# Patient Record
Sex: Female | Born: 1960 | Race: Black or African American | Hispanic: No | Marital: Single | Smoking: Never smoker
Health system: Southern US, Community
[De-identification: ages and names within clinical notes are randomized; demographics above are authoritative.]

## PROBLEM LIST (undated history)

## (undated) DIAGNOSIS — D649 Anemia, unspecified: Principal | ICD-10-CM

## (undated) HISTORY — DX: Anemia, unspecified: D64.9

---

## 1995-11-29 HISTORY — PX: ABDOMINAL SURGERY: SHX537

## 1999-03-08 ENCOUNTER — Other Ambulatory Visit: Admission: RE | Admit: 1999-03-08 | Discharge: 1999-03-08 | Payer: Self-pay | Admitting: Obstetrics and Gynecology

## 2000-03-22 ENCOUNTER — Other Ambulatory Visit: Admission: RE | Admit: 2000-03-22 | Discharge: 2000-03-22 | Payer: Self-pay | Admitting: Obstetrics and Gynecology

## 2001-08-07 ENCOUNTER — Other Ambulatory Visit: Admission: RE | Admit: 2001-08-07 | Discharge: 2001-08-07 | Payer: Self-pay | Admitting: Obstetrics and Gynecology

## 2001-08-21 ENCOUNTER — Encounter: Admission: RE | Admit: 2001-08-21 | Discharge: 2001-08-21 | Payer: Self-pay | Admitting: General Surgery

## 2001-08-21 ENCOUNTER — Encounter: Payer: Self-pay | Admitting: General Surgery

## 2002-12-26 ENCOUNTER — Other Ambulatory Visit: Admission: RE | Admit: 2002-12-26 | Discharge: 2002-12-26 | Payer: Self-pay | Admitting: Obstetrics and Gynecology

## 2002-12-31 ENCOUNTER — Encounter: Payer: Self-pay | Admitting: Obstetrics and Gynecology

## 2002-12-31 ENCOUNTER — Ambulatory Visit (HOSPITAL_COMMUNITY): Admission: RE | Admit: 2002-12-31 | Discharge: 2002-12-31 | Payer: Self-pay | Admitting: Obstetrics and Gynecology

## 2003-08-12 ENCOUNTER — Emergency Department (HOSPITAL_COMMUNITY): Admission: EM | Admit: 2003-08-12 | Discharge: 2003-08-12 | Payer: Self-pay | Admitting: Emergency Medicine

## 2003-11-03 ENCOUNTER — Other Ambulatory Visit: Admission: RE | Admit: 2003-11-03 | Discharge: 2003-11-03 | Payer: Self-pay | Admitting: Obstetrics and Gynecology

## 2004-12-29 ENCOUNTER — Other Ambulatory Visit: Admission: RE | Admit: 2004-12-29 | Discharge: 2004-12-29 | Payer: Self-pay | Admitting: Obstetrics and Gynecology

## 2005-11-30 ENCOUNTER — Encounter: Admission: RE | Admit: 2005-11-30 | Discharge: 2005-11-30 | Payer: Self-pay | Admitting: Obstetrics and Gynecology

## 2007-02-04 IMAGING — US UNKNOWN US STUDY
1 series · 5 of 5 positions shown · non-contrast
Comparison: none

UNILATERAL RIGHT DIAGNOSTIC DIGITAL MAMMOGRAM AND RIGHT BREAST ULTRASOUND:
CLINICAL DATA: Screening call-back for right breast mass. Pathology of this area on previous 
biopsy in 9559 demonstrated changes that were felt to be consistent with fibroadenoma, which was 
concordant with the imaging findings at that time.

[Series 1: unknown us study · 5 of 5 slices shown]
[im 1/5]
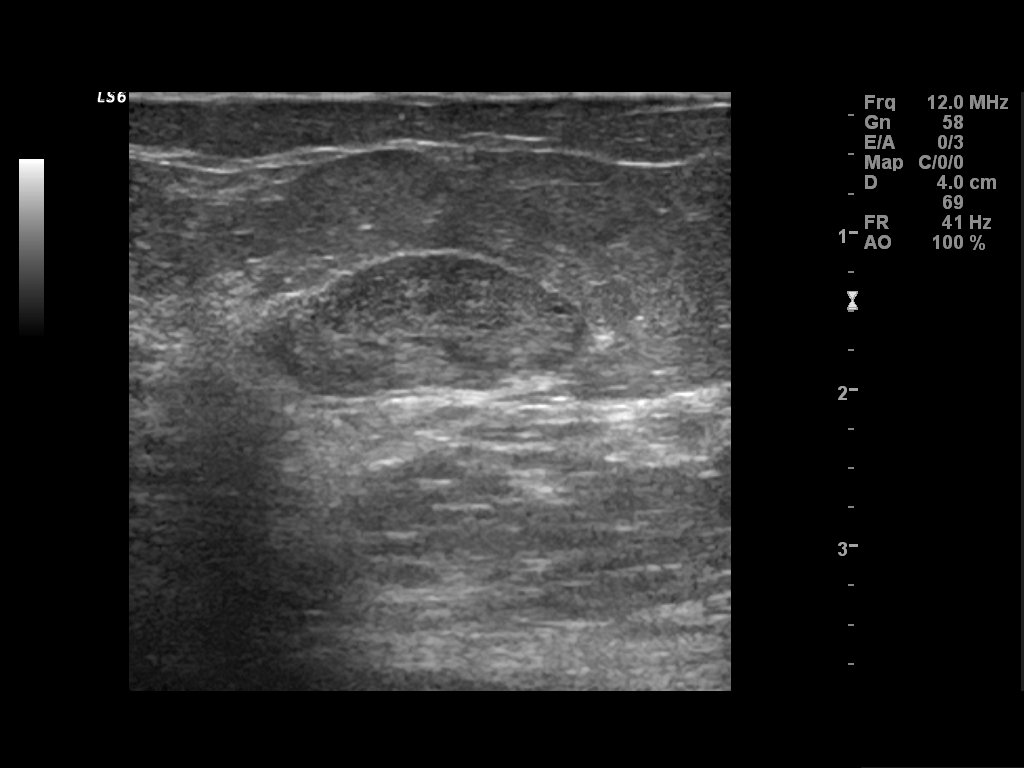
[im 2/5]
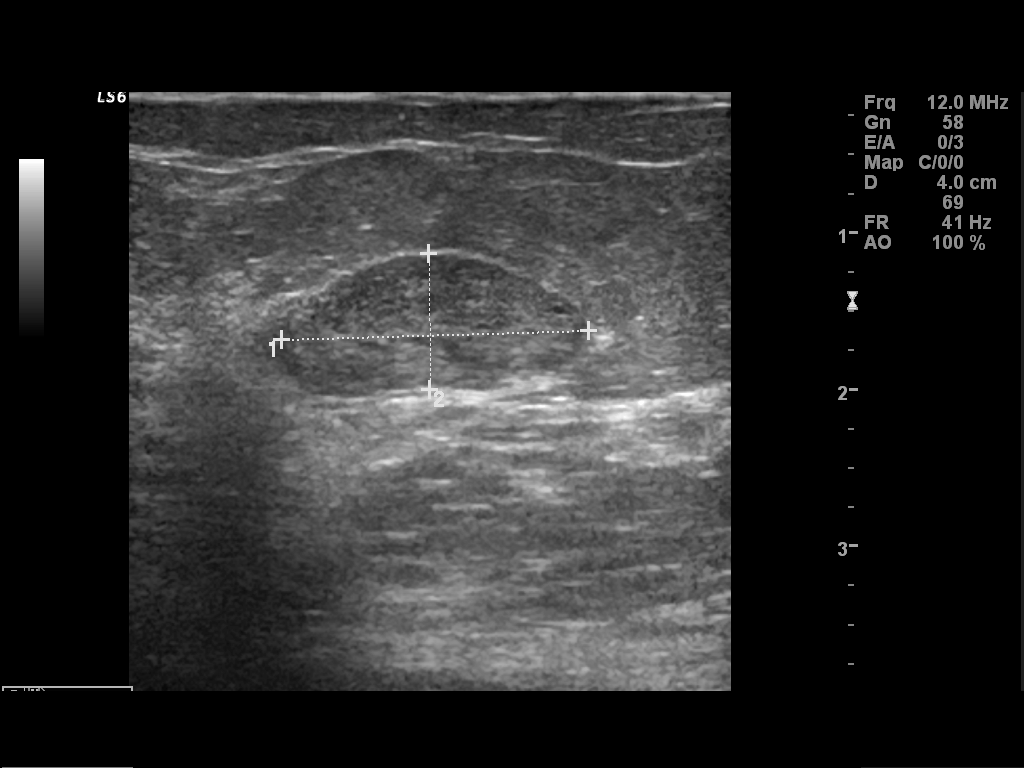
[im 3/5]
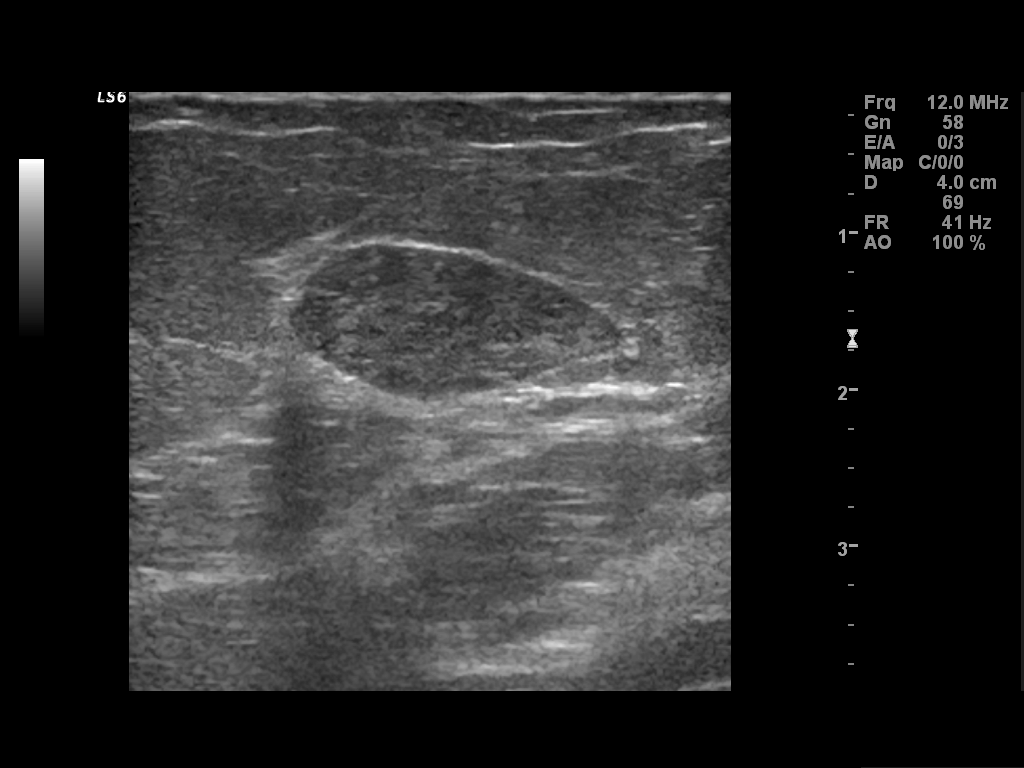
[im 4/5]
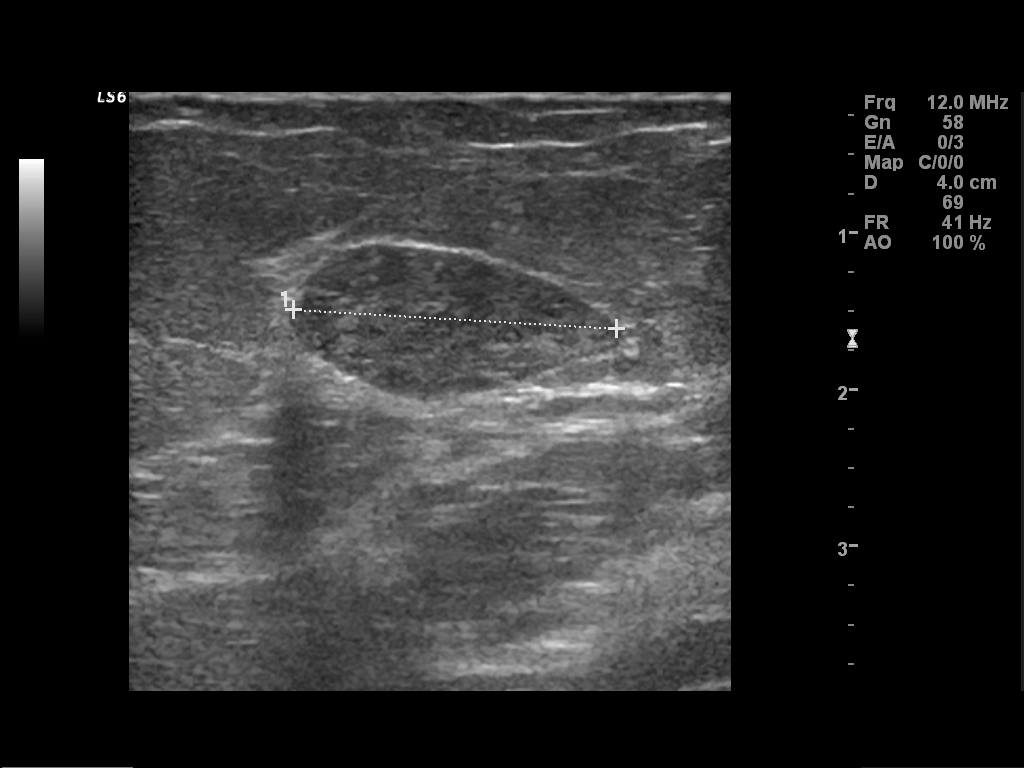
[im 5/5]
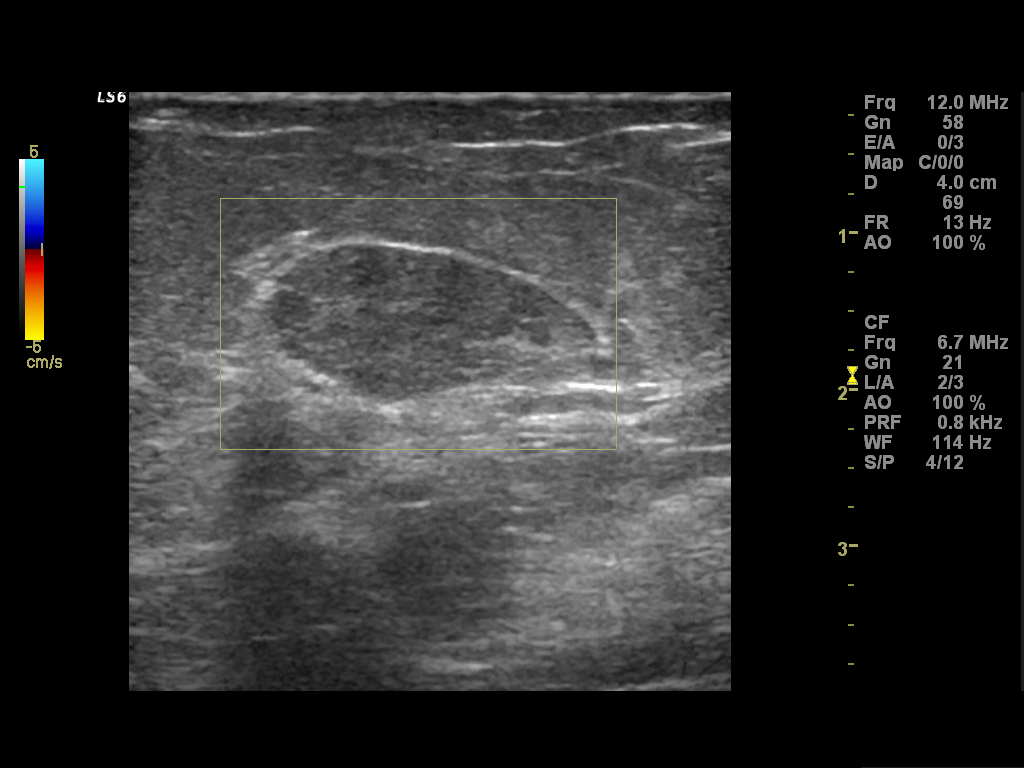

[5 of 5 positions shown; findings below may reference images not displayed]

Comparison with mammograms from [REDACTED] dated 11-03-05, 08-01-03 and ultrasound-guided 
core biopsy 08-21-01.

The previously seen oval circumscribed mass in the right upper outer quadrant measuring 2.5 x
cm is confirmed when compared to the screening study.  Ultrasound is recommended.

Ultrasound of the right breast 10 o'clock location, 7 cm from the nipple, demonstrates a 2.1 x
x 0.9 cm circumscribed oval isoechoic mass with no appreciable through transmission or shadowing.  
This corresponds to the mammographic finding.
IMPRESSION: Interval increase in size since the multiple years prior studies of a right breast mass in the 10 
o'clock location, previously biopsied and pathologically proven to likely be a fibroadenoma. Given 
that the imaging findings are consistent with this diagnosis and no worrisome feature is seen, this
is amenable to six month interval right breast ultrasound follow-up. Fibroadenomata may change 
with size over time, as discussed with the patient.  She is instructed to contact us sooner should 
the area become palpable or if she should change her mind regarding follow-up and opt for either 
biopsy or surgical excision, both of which were discussed with the patient as alternatives to 
follow-up.  Findings and recommendations discussed with the patient and provided in written form at
the time of the exam.

ASSESSMENT: Probably benign - BI-RADS 3

Ultrasound of the right breast in 6 months.

## 2007-02-04 IMAGING — MG MM MAMMO LIMITED*R*
2 series · 2 of 2 positions shown · non-contrast
Comparison: none

UNILATERAL RIGHT DIAGNOSTIC DIGITAL MAMMOGRAM AND RIGHT BREAST ULTRASOUND:
CLINICAL DATA: Screening call-back for right breast mass. Pathology of this area on previous 
biopsy in 9559 demonstrated changes that were felt to be consistent with fibroadenoma, which was 
concordant with the imaging findings at that time.

[R CC]
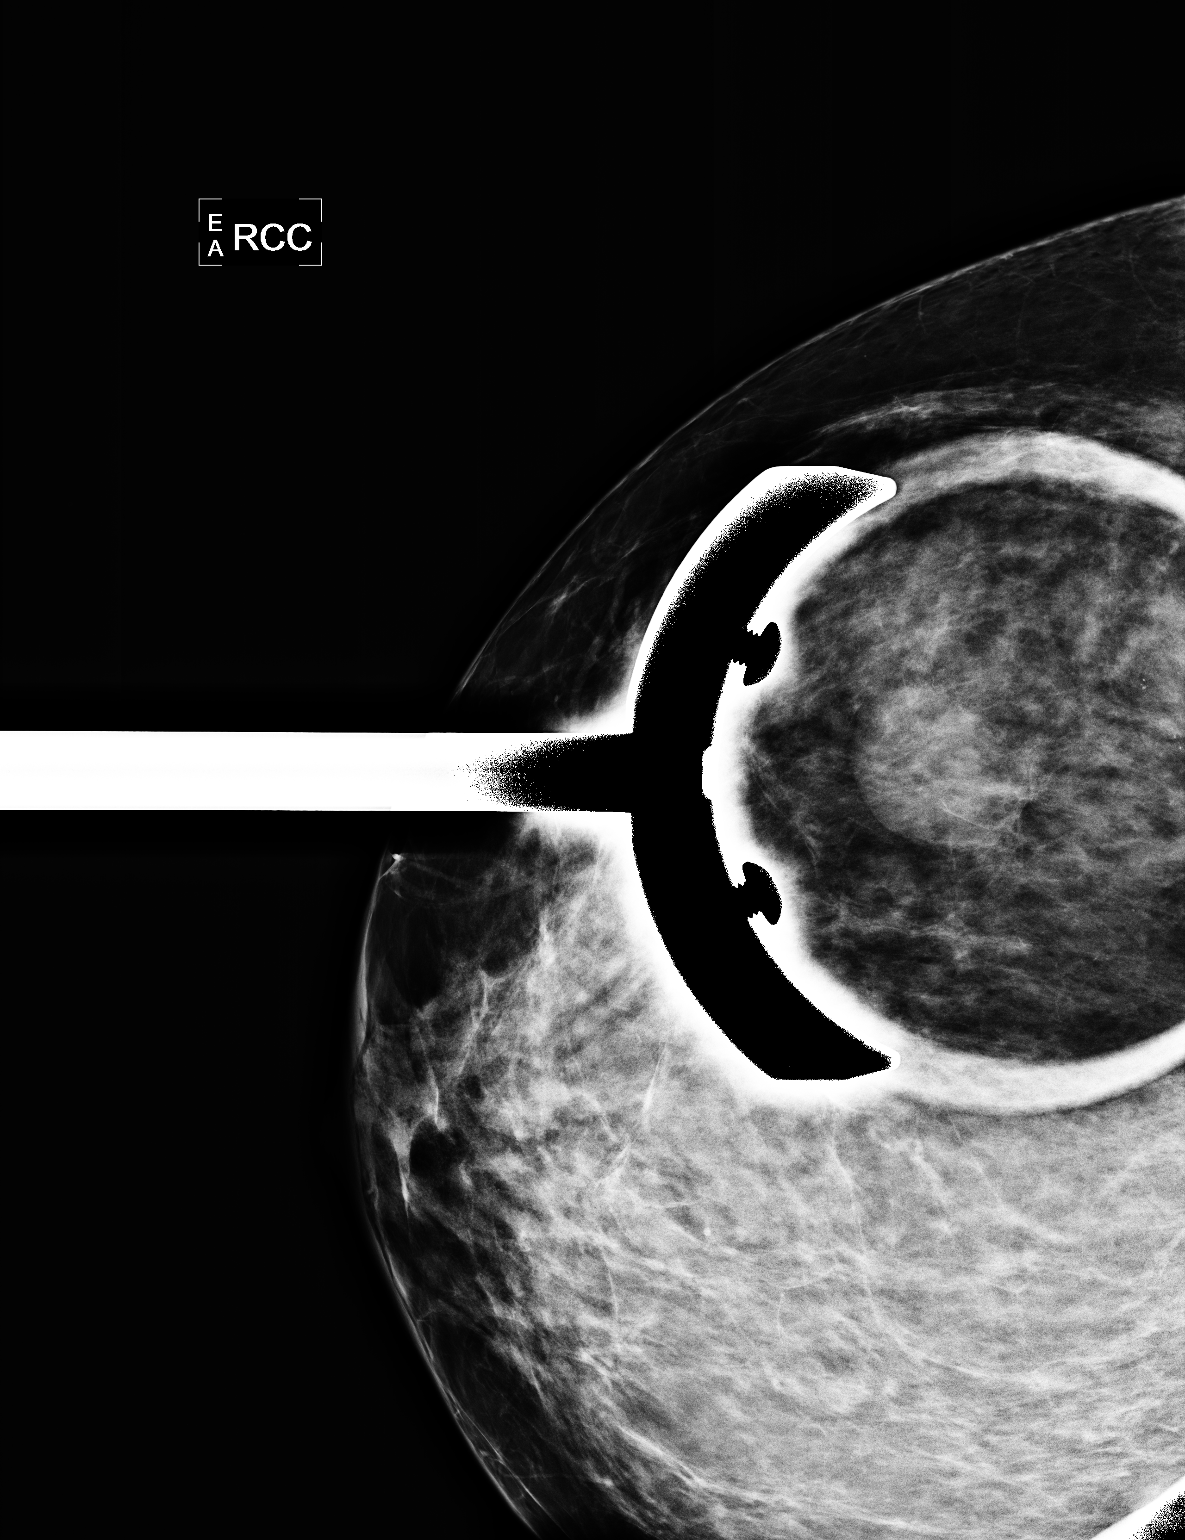

[R MLO]
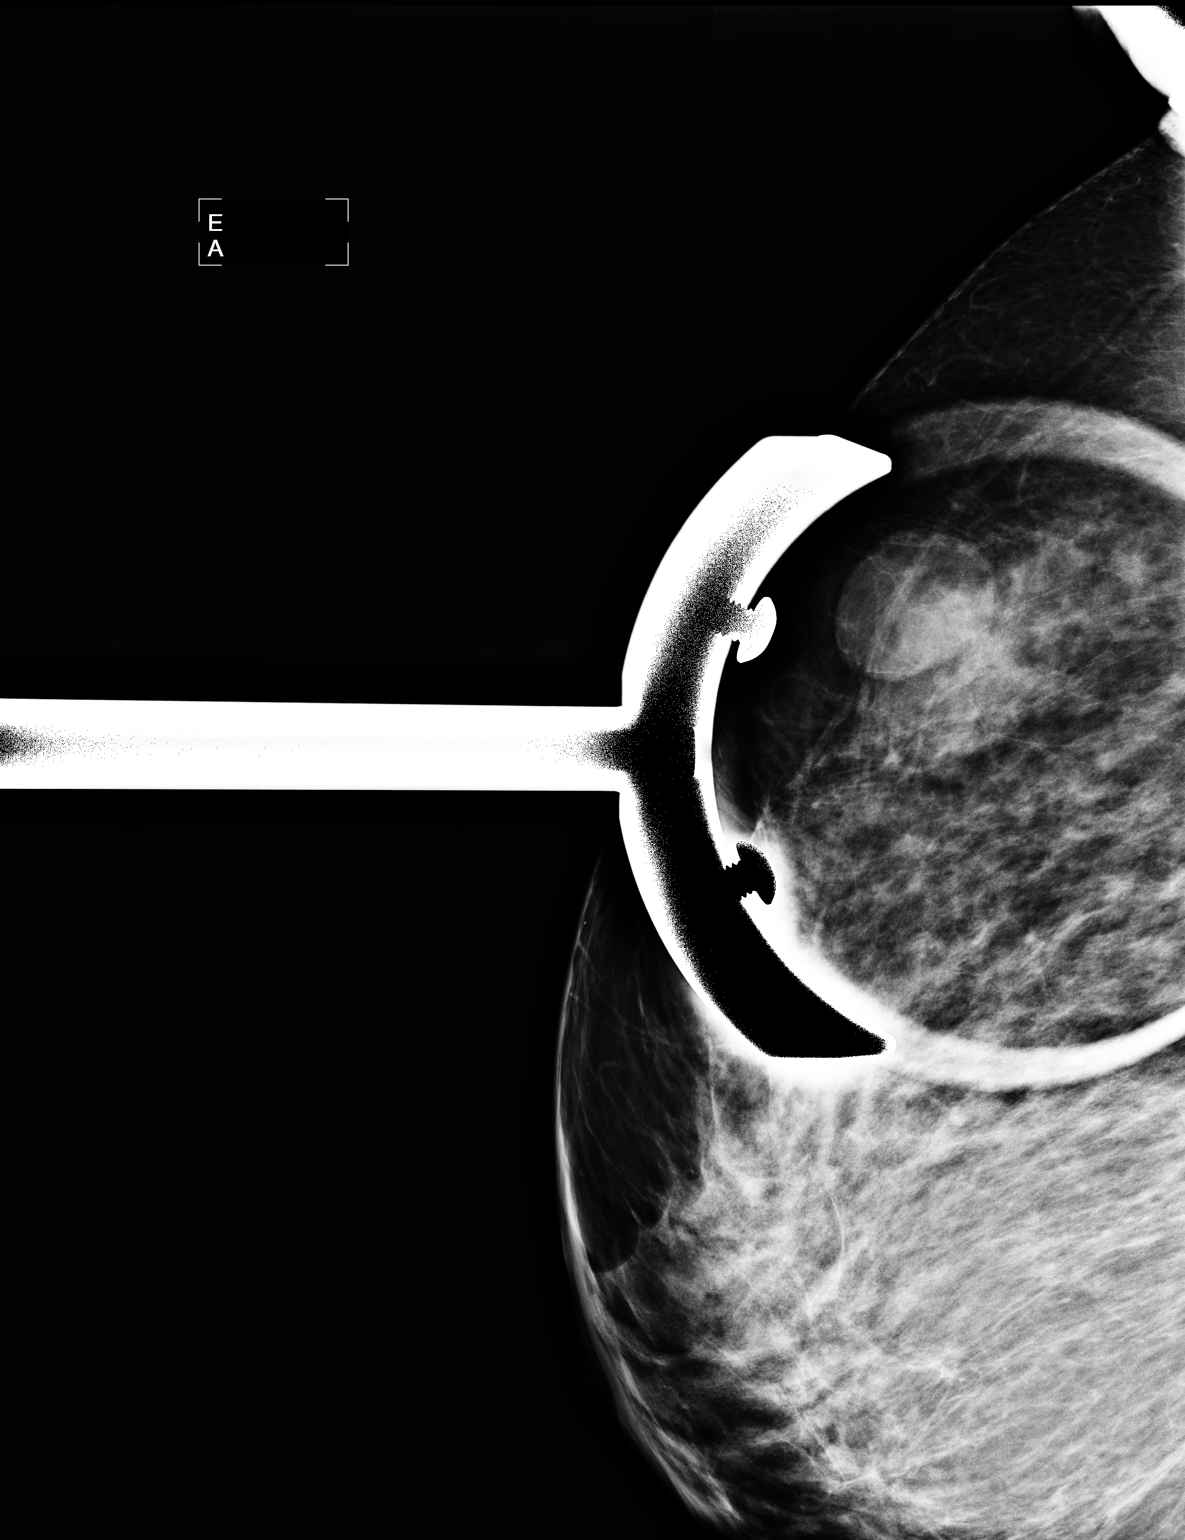

[2 of 2 positions shown; findings below may reference images not displayed]

Comparison with mammograms from [REDACTED] dated 11-03-05, 08-01-03 and ultrasound-guided 
core biopsy 08-21-01.

The previously seen oval circumscribed mass in the right upper outer quadrant measuring 2.5 x
cm is confirmed when compared to the screening study.  Ultrasound is recommended.

Ultrasound of the right breast 10 o'clock location, 7 cm from the nipple, demonstrates a 2.1 x
x 0.9 cm circumscribed oval isoechoic mass with no appreciable through transmission or shadowing.  
This corresponds to the mammographic finding.
IMPRESSION: Interval increase in size since the multiple years prior studies of a right breast mass in the 10 
o'clock location, previously biopsied and pathologically proven to likely be a fibroadenoma. Given 
that the imaging findings are consistent with this diagnosis and no worrisome feature is seen, this
is amenable to six month interval right breast ultrasound follow-up. Fibroadenomata may change 
with size over time, as discussed with the patient.  She is instructed to contact us sooner should 
the area become palpable or if she should change her mind regarding follow-up and opt for either 
biopsy or surgical excision, both of which were discussed with the patient as alternatives to 
follow-up.  Findings and recommendations discussed with the patient and provided in written form at
the time of the exam.

ASSESSMENT: Probably benign - BI-RADS 3

Ultrasound of the right breast in 6 months.

## 2011-10-04 ENCOUNTER — Emergency Department
Admission: EM | Admit: 2011-10-04 | Discharge: 2011-10-04 | Disposition: A | Discharge status: Home / Self Care | Payer: BC Managed Care – PPO | Source: Home / Self Care | Attending: Emergency Medicine | Admitting: Emergency Medicine

## 2011-10-04 ENCOUNTER — Encounter: Payer: Self-pay | Admitting: *Deleted

## 2011-10-04 DIAGNOSIS — G43009 Migraine without aura, not intractable, without status migrainosus: Secondary | ICD-10-CM

## 2011-10-04 MED ORDER — TRAMADOL-ACETAMINOPHEN 37.5-325 MG PO TABS: 2.0000 | ORAL_TABLET | Freq: Four times a day (QID) | ORAL | Status: AC | PRN

## 2011-10-04 MED ORDER — KETOROLAC TROMETHAMINE 60 MG/2ML IM SOLN
60.0000 mg | Freq: Once | INTRAMUSCULAR | Status: AC
  Administered 2011-10-04: 60 mg via INTRAMUSCULAR

## 2011-10-04 MED ORDER — ONDANSETRON 4 MG PO TBDP: 4.0000 mg | ORAL_TABLET | Freq: Three times a day (TID) | ORAL | Status: AC | PRN

## 2011-10-04 NOTE — ED Notes (Signed)
Pt c/o headache x Sunday. She believes that her HA could be related to stress and her menstrual cycle. She has taken Aleve with no relief.

## 2011-10-04 NOTE — ED Provider Notes (Signed)
History     CSN: [id number] Arrival date & time: 10/04/2011  9:26 AM   First MD Initiated Contact with Patient 10/04/11 [phone]      Chief Complaint  Patient presents with  . Headache    (Consider location/radiation/quality/duration/timing/severity/associated sxs/prior treatment) Patient is a 50 y.o. female presenting with headaches. The history is provided by the patient.  Headache The primary symptoms include headaches (Right peri-orbital and right temporal. Current intensity 4/10. Was a 9 out of 10 two days ago.) and nausea. Primary symptoms do not include syncope, loss of consciousness, altered mental status, seizures, dizziness, visual change, paresthesias, focal weakness, loss of sensation, speech change, memory loss, fever or vomiting. The symptoms began 2 days ago. The symptoms are improving. Context: Usually occur monthly around her period, and her LNMP started 3 days ago. Also, she feels stress is a factor, as she works full-time, and pursuing her [employer], and is a parent of a teenager.  The headache is associated with photophobia. The headache is not associated with aura, visual change, neck stiffness, paresthesias, weakness or loss of balance.  Additional symptoms include photophobia. Additional symptoms do not include neck stiffness, weakness, lower back pain, leg pain, loss of balance, aura, hallucinations, taste disturbance, tinnitus or vertigo. Medical issues do not include seizures, cerebral vascular accident, cancer, alcohol use, drug use, diabetes, hypertension or recent surgery.    History reviewed. No pertinent past medical history.  Past Surgical History  Procedure Date  . Abdominal surgery 1997    c-section    Family History  Problem Relation Age of Onset  . Diabetes Father   . Hypertension Father     History  Substance Use Topics  . Smoking status: Never Smoker   . Smokeless tobacco: Not on file  . Alcohol Use: Yes     occasionally 1 drink per mth    OB  History    Grav Para Term Preterm Abortions TAB SAB Ect Mult Living                  Review of Systems  Constitutional: Negative for fever.  HENT: Negative for nosebleeds, congestion, facial swelling, neck pain, neck stiffness and tinnitus.   Eyes: Positive for photophobia. Negative for visual disturbance.  Cardiovascular: Negative for syncope.  Gastrointestinal: Positive for nausea. Negative for vomiting.  Neurological: Positive for headaches (Right peri-orbital and right temporal. Current intensity 4/10. Was a 9 out of 10 two days ago.). Negative for dizziness, vertigo, speech change, focal weakness, seizures, loss of consciousness, weakness, paresthesias and loss of balance.  Psychiatric/Behavioral: Negative.  Negative for hallucinations, memory loss and altered mental status.  All other systems reviewed and are negative.    Allergies  Tetracyclines & related  Home Medications   Current Outpatient Rx  Name Route Sig Dispense Refill  . ONDANSETRON 4 MG PO TBDP Oral Take 1 tablet (4 mg total) by mouth every 8 (eight) hours as needed for nausea (migraine). 12 tablet 0  . TRAMADOL-ACETAMINOPHEN 37.5-325 MG PO TABS Oral Take 2 tablets by mouth every 6 (six) hours as needed (headache). 20 tablet 0    BP 147/96  Pulse 94  Temp(Src) 98.4 F (36.9 C) (Oral)  Resp 18  Ht 5\' 7"  (1.702 m)  Wt 176 lb 8 oz (80.06 kg)  BMI 27.64 kg/m2  SpO2 97%  LMP 10/01/2011  Physical Exam  Nursing note and vitals reviewed. Constitutional: She appears well-developed and well-nourished. She appears distressed (Uncomfortable from headache.).  HENT:  Head: Normocephalic  and atraumatic. Head is without raccoon's eyes, without contusion, without right periorbital erythema and without left periorbital erythema.  Right Ear: External ear and ear canal normal. No drainage. Tympanic membrane is not erythematous.  Left Ear: External ear and ear canal normal. No drainage. Tympanic membrane is not  erythematous.  Nose: Nose normal. Right sinus exhibits no maxillary sinus tenderness. Left sinus exhibits no maxillary sinus tenderness.  Mouth/Throat: Oropharynx is clear and moist and mucous membranes are normal. No oral lesions. No dental abscesses. No oropharyngeal exudate, posterior oropharyngeal edema or posterior oropharyngeal erythema.       + Tenderness to palpation right temporal area.    No cranial bruits.  Normal temporal arteries.  Eyes: Conjunctivae, EOM and lids are normal. Pupils are equal, round, and reactive to light.  Fundoscopic exam:      The right eye shows no exudate, no hemorrhage and no papilledema.       The left eye shows no exudate, no hemorrhage and no papilledema.  Neck: Neck supple. Normal carotid pulses and no JVD present. Carotid bruit is not present. No tracheal deviation present. No thyromegaly present.  Cardiovascular: Regular rhythm and normal heart sounds.   Pulmonary/Chest: Effort normal and breath sounds normal. No respiratory distress. She has no wheezes. She has no rales.  Abdominal: Soft. There is no tenderness.  Musculoskeletal: She exhibits no edema and no tenderness.  Neurological: She is alert. She has normal strength and normal reflexes. No cranial nerve deficit or sensory deficit. Coordination normal.  Reflex Scores:      Tricep reflexes are 2+ on the right side and 2+ on the left side.      Bicep reflexes are 2+ on the right side and 2+ on the left side.      Brachioradialis reflexes are 2+ on the right side and 2+ on the left side.      Patellar reflexes are 2+ on the right side and 2+ on the left side.      Achilles reflexes are 2+ on the right side and 2+ on the left side. Skin: Skin is warm and dry. No rash noted.  Psychiatric: She has a normal mood and affect.    ED Course  Procedures:  Toradol 6 mg IM. Good response. Headache improved.   Dx: 1. Migraine without aura    Plans: See instruction sheet. Given to patient.  Questions invited and answered. Rx: ondansetron (ZOFRAN ODT) 4 MG disintegrating tablet Take 1 tablet (4 mg total) by mouth every 8 (eight) hours as needed for nausea (migraine).traMADol-acetaminophen (ULTRACET) 37.5-325 MG per tablet Take 2 tablets by mouth every 6 (six) hours as needed (headache). Follow-up with a headache specialist in 1 week, sooner prn.           Lonell Face, MD 10/04/11 [phone]

## 2012-07-25 ENCOUNTER — Encounter: Payer: Self-pay | Admitting: *Deleted

## 2012-07-25 ENCOUNTER — Emergency Department
Admission: EM | Admit: 2012-07-25 | Discharge: 2012-07-25 | Disposition: A | Discharge status: Home / Self Care | Payer: BC Managed Care – PPO | Source: Home / Self Care

## 2012-07-25 DIAGNOSIS — Z3202 Encounter for pregnancy test, result negative: Secondary | ICD-10-CM

## 2012-07-25 NOTE — ED Provider Notes (Signed)
Agree with exam, assessment, and plan.   Stephen A Beese, MD 07/25/12 1929 

## 2012-07-25 NOTE — ED Provider Notes (Signed)
History     CSN: [id number]  Arrival date & time 07/25/12  1801     Chief Complaint  Patient presents with  . Possible Pregnancy    HPI Comments: Patient presents today for a urine pregnancy test.  Her LMP was 06/11/2012.  She is concerned that she might be pregnant because she has missed her period by 2 weeks.  She has done 2 pregnancy tests at home which were negative.  She is also concerned whether she is going through menopause.    Denies experiencing hot flashes.  The history is provided by the patient.    History reviewed. No pertinent past medical history.  Past Surgical History  Procedure Date  . Abdominal surgery 1997    c-section    Family History  Problem Relation Age of Onset  . Diabetes Father   . Hypertension Father     History  Substance Use Topics  . Smoking status: Never Smoker   . Smokeless tobacco: Not on file  . Alcohol Use: Yes     occasionally 1 drink per mth    OB History    Grav Para Term Preterm Abortions TAB SAB Ect Mult Living                  Review of Systems  Constitutional: Negative.   HENT: Negative.   Eyes: Negative.   Respiratory: Negative.   Cardiovascular: Negative.   Gastrointestinal: Negative.   Genitourinary: Negative.   Musculoskeletal: Negative.   Neurological: Negative.   All other systems reviewed and are negative.    Allergies  Tetracyclines & related  Home Medications  No current outpatient prescriptions on file.  BP 133/84  Pulse 93  Temp 98.2 F (36.8 C) (Oral)  Resp 16  Ht 5\' 7"  (1.702 m)  Wt 178 lb (80.74 kg)  BMI 27.88 kg/m2  LMP 06/11/2012  Physical Exam  Nursing note and vitals reviewed.   ED Course  Procedures    Labs Reviewed  POCT URINE PREGNANCY: Negative     1. Encounter for pregnancy test with result negative       MDM  Urine Pregnancy test negative. She is concerned whether she starting menopause since she has missed her period by 2 weeks.  Referred her to her  gynecologist for further testing.        Junius Roads, NP 07/25/12 1848

## 2012-07-25 NOTE — ED Notes (Signed)
The pt is here today for a pregnancy test.

## 2013-03-04 ENCOUNTER — Telehealth: Payer: Self-pay | Admitting: Oncology

## 2013-03-04 NOTE — Telephone Encounter (Signed)
S/W PT IN RE TO NP APPT 05/07 @ 1:30 W/DR. SHADAD REFERRING DR. Richarda Overlie DX-AMEMIA WELCOME PACKET MAILED.

## 2013-03-04 NOTE — Telephone Encounter (Signed)
LVOM FOR PT TO RETURN CALL.  °

## 2013-03-06 ENCOUNTER — Telehealth: Payer: Self-pay | Admitting: Oncology

## 2013-03-06 NOTE — Telephone Encounter (Signed)
C/D 03/06/13 for appt. 04/03/13

## 2013-04-03 ENCOUNTER — Other Ambulatory Visit: Payer: BC Managed Care – PPO | Admitting: Lab

## 2013-04-03 ENCOUNTER — Ambulatory Visit: Payer: BC Managed Care – PPO | Admitting: Oncology

## 2013-04-03 ENCOUNTER — Ambulatory Visit: Payer: BC Managed Care – PPO

## 2013-04-12 ENCOUNTER — Other Ambulatory Visit: Payer: Self-pay | Admitting: Oncology

## 2013-04-12 DIAGNOSIS — D500 Iron deficiency anemia secondary to blood loss (chronic): Secondary | ICD-10-CM

## 2013-04-17 ENCOUNTER — Other Ambulatory Visit (HOSPITAL_BASED_OUTPATIENT_CLINIC_OR_DEPARTMENT_OTHER): Payer: BC Managed Care – PPO | Admitting: Lab

## 2013-04-17 ENCOUNTER — Telehealth: Payer: Self-pay | Admitting: Oncology

## 2013-04-17 ENCOUNTER — Ambulatory Visit: Payer: BC Managed Care – PPO

## 2013-04-17 ENCOUNTER — Ambulatory Visit (HOSPITAL_BASED_OUTPATIENT_CLINIC_OR_DEPARTMENT_OTHER): Payer: BC Managed Care – PPO | Admitting: Oncology

## 2013-04-17 ENCOUNTER — Encounter: Payer: Self-pay | Admitting: Oncology

## 2013-04-17 ENCOUNTER — Telehealth: Payer: Self-pay | Admitting: *Deleted

## 2013-04-17 VITALS — BP 149/80 | HR 91 | Temp 98.1°F | Resp 18 | Ht 67.0 in | Wt 168.7 lb

## 2013-04-17 DIAGNOSIS — D259 Leiomyoma of uterus, unspecified: Secondary | ICD-10-CM

## 2013-04-17 DIAGNOSIS — D500 Iron deficiency anemia secondary to blood loss (chronic): Secondary | ICD-10-CM

## 2013-04-17 DIAGNOSIS — D473 Essential (hemorrhagic) thrombocythemia: Secondary | ICD-10-CM

## 2013-04-17 DIAGNOSIS — D649 Anemia, unspecified: Secondary | ICD-10-CM

## 2013-04-17 DIAGNOSIS — D509 Iron deficiency anemia, unspecified: Secondary | ICD-10-CM

## 2013-04-17 DIAGNOSIS — N920 Excessive and frequent menstruation with regular cycle: Secondary | ICD-10-CM

## 2013-04-17 HISTORY — DX: Anemia, unspecified: D64.9

## 2013-04-17 LAB — COMPREHENSIVE METABOLIC PANEL (CC13)
CO2: 23 mEq/L (ref 22–29)
Creatinine: 0.7 mg/dL (ref 0.6–1.1)
Glucose: 123 mg/dl — ABNORMAL HIGH (ref 70–99)
Sodium: 139 mEq/L (ref 136–145)
Total Bilirubin: 0.27 mg/dL (ref 0.20–1.20)
Total Protein: 7.8 g/dL (ref 6.4–8.3)

## 2013-04-17 LAB — CBC WITH DIFFERENTIAL/PLATELET
Eosinophils Absolute: 0 10*3/uL (ref 0.0–0.5)
HCT: 27 % — ABNORMAL LOW (ref 34.8–46.6)
LYMPH%: 31.4 % (ref 14.0–49.7)
MONO#: 0.4 10*3/uL (ref 0.1–0.9)
NEUT#: 4.5 10*3/uL (ref 1.5–6.5)
NEUT%: 61.6 % (ref 38.4–76.8)
Platelets: 524 10*3/uL — ABNORMAL HIGH (ref 145–400)
WBC: 7.3 10*3/uL (ref 3.9–10.3)

## 2013-04-17 LAB — CHCC SMEAR

## 2013-04-17 LAB — FERRITIN: Ferritin: <1 ng/mL — ABNORMAL LOW (ref 10–291)

## 2013-04-17 LAB — IRON AND TIBC: TIBC: 532 ug/dL — ABNORMAL HIGH (ref 250–470)

## 2013-04-17 NOTE — Progress Notes (Signed)
Checked in new pt with no financial concerns. °

## 2013-04-17 NOTE — Telephone Encounter (Signed)
Per staff message and POF I have scheduled appts.  JMW  

## 2013-04-17 NOTE — Telephone Encounter (Signed)
gv and printed appt sched emaield MW to add tx...will call pt with d.t for iron

## 2013-04-17 NOTE — Telephone Encounter (Signed)
s.w. pt and advised on 5.23.14 appt...pt ok and aware

## 2013-04-17 NOTE — Progress Notes (Signed)
Reason for Referral: Anemia.   HPI: Lindsey Barker is a 52 year old woman currently of [place] and have being living there for a while. She is a rather healthy woman with past medical history significant for uterine fibroids and menorrhagia. She have been following up with Dr. Marcelle Barker from OB/GYN regarding this issue. She had been recently started on hormone therapy which have decreased her menstrual bleeding. However over the last few months she have been developing more and more fatigued and more and more tiredness. She had a CBC done on January of 2014 and showed a hemoglobin of 7.9 white cell count of 7.3 and platelet count of 552. Her MCV was low at 64.5 an elevated RDW of 19.1. She has been erratically on iron and have been tolerating it poorly. She have reported problems with constipation and has not been taking it regularly.  She has not reported any bleeding anywhere else. She has not reported any problems with hematochezia, melena, hemoptysis, hematemesis or any epistaxis.  Despite all of these symptoms she still able to function very well and it is to most activities of daily living. She remains very active is a single mother and a full-time job at a [employer].  She denied any chest pain or shortness of breath or any dyspnea on exertion.   Past Medical History  Diagnosis Date  . Anemia, unspecified 04/17/2013  :  Past Surgical History  Procedure Laterality Date  . Abdominal surgery  1997    c-section  :  Current outpatient prescriptions: Current Outpatient Prescriptions  Medication Sig Dispense Refill  . Norethin Ace-Eth Estrad-FE (JUNEL FE 1.5/30 PO) Take 1 tablet by mouth daily.       No current facility-administered medications for this visit.     Allergies  Allergen Reactions  . Tetracyclines & Related   :  Family History  Problem Relation Age of Onset  . Diabetes Father   . Hypertension Father   :  History   Social History  . Marital  Status: Married    Spouse Name: N/A    Number of Children: N/A  . Years of Education: N/A   Occupational History  . Not on file.   Social History Main Topics  . Smoking status: Never Smoker   . Smokeless tobacco: Not on file  . Alcohol Use: Yes     Comment: occasionally 1 drink per mth  . Drug Use: No  . Sexually Active:    Other Topics Concern  . Not on file   Social History Narrative  . No narrative on file  :  A comprehensive review of systems was negative.  Exam: Blood pressure 149/80, pulse 91, temperature 98.1 F (36.7 C), temperature source Oral, resp. rate 18, height 5\' 7"  (1.702 m), weight 168 lb 11.2 oz (76.522 kg). General appearance: alert, cooperative and appears stated age Head: Normocephalic, without obvious abnormality, atraumatic Nose: Nares normal. Septum midline. Mucosa normal. No drainage or sinus tenderness. Throat: lips, mucosa, and tongue normal; teeth and gums normal Neck: no adenopathy, no carotid bruit, no JVD, supple, symmetrical, trachea midline and thyroid not enlarged, symmetric, no tenderness/mass/nodules Resp: clear to auscultation bilaterally Chest wall: no tenderness Cardio: regular rate and rhythm, S1, S2 normal, no murmur, click, rub or gallop GI: soft, non-tender; bowel sounds normal; no masses,  no organomegaly Extremities: extremities normal, atraumatic, no cyanosis or edema Lymph nodes: Cervical, supraclavicular, and axillary nodes normal.   Recent Labs  04/17/13 1044  WBC 7.3  HGB 8.3*  HCT 27.0*  PLT 524*      Blood smear review: Microcytosis and hypochromia.     No results found.  Assessment and Plan:  52 year old a woman with the following issues:  1. Microcytic, hypochromic anemia: The differential diagnosis discussed with [name] Undoubtedly she has iron deficiency anemia likely due to menorrhagia from uterine fibroids. A likely she has a hemoglobinopathy such as sickle cell or thalassemia. To confirm that,  I will repeat her iron studies today including ferritin and total iron. She had been on iron replacement orally but could not tolerate it very well and consistently. She has reported problems with constipation among other GI distress issues. I discussed the alternatives to oral iron replacement in the form of IV iron. There are a number of different formulations that have been used in the past including iron dextran versus Feraheme. The risks and benefits of both approaches were discussed and she is in favor of using Feraheme. Complications from this particular drug was discussed today including infusion-related toxicities such as arthralgias myalgias and rare cases of anaphylaxis. I also discussed with her the possibility that she might need repeat infusions in the future. She is agreeable to proceed and we'll set that up for her in the near future.  2. Thrombocytosis: This is most likely reactive in nature due to her iron deficiency anemia and anticipate that'll correct once her iron deficiency have been corrected.  All her questions are answered today she will followup in 3 months for repeat iron studies.

## 2013-04-19 ENCOUNTER — Ambulatory Visit (HOSPITAL_BASED_OUTPATIENT_CLINIC_OR_DEPARTMENT_OTHER): Payer: BC Managed Care – PPO

## 2013-04-19 VITALS — BP 114/67 | HR 87 | Temp 98.7°F

## 2013-04-19 DIAGNOSIS — D509 Iron deficiency anemia, unspecified: Secondary | ICD-10-CM

## 2013-04-19 DIAGNOSIS — D649 Anemia, unspecified: Secondary | ICD-10-CM

## 2013-04-19 MED ORDER — SODIUM CHLORIDE 0.9 % IV SOLN
1020.0000 mg | Freq: Once | INTRAVENOUS | Status: AC
  Administered 2013-04-19: 1020 mg via INTRAVENOUS
  Filled 2013-04-19: qty 34

## 2013-04-19 MED ORDER — SODIUM CHLORIDE 0.9 % IV SOLN
Freq: Once | INTRAVENOUS | Status: AC
  Administered 2013-04-19: 15:00:00 via INTRAVENOUS

## 2013-04-19 NOTE — Patient Instructions (Addendum)
Ferumoxytol injection What is this medicine? FERUMOXYTOL is an iron complex. Iron is used to make healthy red blood cells, which carry oxygen and nutrients throughout the body. This medicine is used to treat iron deficiency anemia in people with chronic kidney disease. This medicine may be used for other purposes; ask your health care provider or pharmacist if you have questions. What should I tell my health care provider before I take this medicine? They need to know if you have any of these conditions: -anemia not caused by low iron levels -high levels of iron in the blood -magnetic resonance imaging (MRI) test scheduled -an unusual or allergic reaction to iron, other medicines, foods, dyes, or preservatives -pregnant or trying to get pregnant -breast-feeding How should I use this medicine? This medicine is for infusion into a vein. It is given by a health care professional in a hospital or clinic setting. Talk to your pediatrician regarding the use of this medicine in children. Special care may be needed. Overdosage: If you think you've taken too much of this medicine contact a poison control center or emergency room at once. Overdosage: If you think you have taken too much of this medicine contact a poison control center or emergency room at once. NOTE: This medicine is only for you. Do not share this medicine with others. What if I miss a dose? It is important not to miss your dose. Call your doctor or health care professional if you are unable to keep an appointment. What may interact with this medicine? This medicine may interact with the following medications: -other iron products This list may not describe all possible interactions. Give your health care provider a list of all the medicines, herbs, non-prescription drugs, or dietary supplements you use. Also tell them if you smoke, drink alcohol, or use illegal drugs. Some items may interact with your medicine. What should I watch  for while using this medicine? Visit your doctor or healthcare professional regularly. Tell your doctor or healthcare professional if your symptoms do not start to get better or if they get worse. You may need blood work done while you are taking this medicine. You may need to follow a special diet. Talk to your doctor. Foods that contain iron include: whole grains/cereals, dried fruits, beans, or peas, leafy green vegetables, and organ meats (liver, kidney). What side effects may I notice from receiving this medicine? Side effects that you should report to your doctor or health care professional as soon as possible: -allergic reactions like skin rash, itching or hives, swelling of the face, lips, or tongue -breathing problems -changes in blood pressure -feeling faint or lightheaded, falls -fever or chills -flushing, sweating, or hot feelings -swelling of the ankles or feet Side effects that usually do not require medical attention (Report these to your doctor or health care professional if they continue or are bothersome.): -diarrhea -headache -nausea, vomiting -stomach pain This list may not describe all possible side effects. Call your doctor for medical advice about side effects. You may report side effects to FDA at 1-800-FDA-1088. Where should I keep my medicine? This drug is given in a hospital or clinic and will not be stored at home. NOTE: This sheet is a summary. It may not cover all possible information. If you have questions about this medicine, talk to your doctor, pharmacist, or health care provider.  2012, [employer]vier/Gold Standard. (08/06/2008 9:48:25 PM) 

## 2013-07-18 ENCOUNTER — Telehealth: Payer: Self-pay | Admitting: Oncology

## 2013-07-18 ENCOUNTER — Encounter: Payer: Self-pay | Admitting: Oncology

## 2013-07-18 ENCOUNTER — Ambulatory Visit (HOSPITAL_BASED_OUTPATIENT_CLINIC_OR_DEPARTMENT_OTHER): Payer: BC Managed Care – PPO | Admitting: Oncology

## 2013-07-18 ENCOUNTER — Other Ambulatory Visit (HOSPITAL_BASED_OUTPATIENT_CLINIC_OR_DEPARTMENT_OTHER): Payer: BC Managed Care – PPO | Admitting: Lab

## 2013-07-18 VITALS — BP 132/92 | HR 74 | Temp 98.1°F | Resp 18 | Ht 67.0 in | Wt 167.5 lb

## 2013-07-18 DIAGNOSIS — D649 Anemia, unspecified: Secondary | ICD-10-CM

## 2013-07-18 LAB — COMPREHENSIVE METABOLIC PANEL (CC13)
Alkaline Phosphatase: 63 U/L (ref 40–150)
BUN: 11.2 mg/dL (ref 7.0–26.0)
Creatinine: 0.7 mg/dL (ref 0.6–1.1)
Glucose: 70 mg/dl (ref 70–140)
Sodium: 140 mEq/L (ref 136–145)
Total Bilirubin: <0.2 mg/dL (ref 0.20–1.20)

## 2013-07-18 LAB — IRON AND TIBC CHCC
%SAT: 10 % — ABNORMAL LOW (ref 21–57)
Iron: 44 ug/dL (ref 41–142)
TIBC: 438 ug/dL (ref 236–444)
UIBC: 393 ug/dL — ABNORMAL HIGH (ref 120–384)

## 2013-07-18 LAB — CBC WITH DIFFERENTIAL/PLATELET
Basophils Absolute: 0 10*3/uL (ref 0.0–0.1)
EOS%: 0.6 % (ref 0.0–7.0)
HGB: 12.9 g/dL (ref 11.6–15.9)
LYMPH%: 33.2 % (ref 14.0–49.7)
MCH: 27.8 pg (ref 25.1–34.0)
MCV: 85.3 fL (ref 79.5–101.0)
MONO%: 6.8 % (ref 0.0–14.0)
NEUT%: 59.2 % (ref 38.4–76.8)
Platelets: 292 10*3/uL (ref 145–400)
RDW: 17.8 % — ABNORMAL HIGH (ref 11.2–14.5)

## 2013-07-18 LAB — FERRITIN CHCC: Ferritin: 9 ng/ml (ref 9–269)

## 2013-07-18 NOTE — Progress Notes (Signed)
Hematology and Oncology Follow Up Visit  TAYGEN NEWSOME [id number] Apr 23, 1961 52 y.o. 07/18/2013 4:14 PM Meriel Pica, MDHolland, Duke Salvia, MD   Principle Diagnosis: Iron deficiency anemia  Prior Therapy: Feraheme 1,020 mg IV on 04/19/13  Current therapy: Watchful observation  Interim History:  Ms Febres is seen for routine follow up today. She received IV iron in May 2014. She tolerated the IV iron well without infusion reaction. She still has fatigue. 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 No chest pain, shortness of breath, or dyspnea. She continues to have heavy periods that are irregular lasting for 7-8 days. No other bleeding noted.   Medications: I have reviewed the patient's current medications.  Current Outpatient Prescriptions  Medication Sig Dispense Refill  . Norethin Ace-Eth Estrad-FE (JUNEL FE 1.5/30 PO) Take 1 tablet by mouth daily.       No current facility-administered medications for this visit.     Allergies:  Allergies  Allergen Reactions  . Tetracyclines & Related     Past Medical History, Surgical history, Social history, and Family History were reviewed and updated.  Review of Systems: Constitutional:  Negative for fever, chills, night sweats, anorexia, weight loss, pain. Cardiovascular: no chest pain or dyspnea on exertion Respiratory: no cough, shortness of breath, or wheezing Neurological: no TIA or stroke symptoms Dermatological: negative ENT: negative Skin: Negative. Gastrointestinal: no abdominal pain, change in bowel habits, or black or bloody stools Genito-Urinary: no dysuria, trouble voiding, or hematuria Hematological and Lymphatic: negative Breast: negative for breast lumps Musculoskeletal: negative Remaining ROS negative.  Physical Exam: Blood pressure 132/92, pulse 74, temperature 98.1 F (36.7 C), temperature source Oral, resp. rate 18, height 5\' 7"  (1.702 m), weight 167 lb 8 oz (75.978 kg), SpO2 98.00%. ECOG:  0 General appearance: alert, cooperative and no distress Head: Normocephalic, without obvious abnormality, atraumatic Neck: no adenopathy, no carotid bruit, no JVD, supple, symmetrical, trachea midline and thyroid not enlarged, symmetric, no tenderness/mass/nodules Lymph nodes: Cervical, supraclavicular, and axillary nodes normal. Heart:regular rate and rhythm, S1, S2 normal, no murmur, click, rub or gallop Lung:chest clear, no wheezing, rales, normal symmetric air entry, no tachypnea, retractions or cyanosis Abdomen: soft, non-tender, without masses or organomegaly EXT:no erythema, induration, or nodules   Lab Results: Lab Results  Component Value Date   WBC 6.4 07/18/2013   HGB 12.9 07/18/2013   HCT 39.6 07/18/2013   MCV 85.3 07/18/2013   PLT 292 07/18/2013     Chemistry      Component Value Date/Time   NA 140 07/18/2013 1313   K 3.9 07/18/2013 1313   CL 109* 04/17/2013 1044   CO2 22 07/18/2013 1313   BUN 11.2 07/18/2013 1313   CREATININE 0.7 07/18/2013 1313      Component Value Date/Time   CALCIUM 9.2 07/18/2013 1313   ALKPHOS 63 07/18/2013 1313   AST 13 07/18/2013 1313   ALT 23 07/18/2013 1313   BILITOT <0.20 07/18/2013 1313      Impression and Plan: This is a 52 year old female with the following issues: 1. Iron deficiency anemia. Due to uterine fibroids. She is unable to tolerate oral iron. She received IV iron with normalization of her Hgb. We will continue to monitor her periodically and repeat IV iron as needed. 2. Uterine fibroids. She is followed by GYN for this. 3. Thrombocytosis. Resolved. This was likely reactive to her iron deficiency. 4. Follow-up. In 3 months  Spent more than half the time coordinating care.  [place], [place] 8/21/20144:14 PM

## 2013-07-18 NOTE — Telephone Encounter (Signed)
Gave pt appt for lab and MD on November 2014 °

## 2013-09-25 ENCOUNTER — Emergency Department
Admission: EM | Admit: 2013-09-25 | Discharge: 2013-09-25 | Disposition: A | Discharge status: Home / Self Care | Payer: BC Managed Care – PPO | Source: Home / Self Care | Attending: Emergency Medicine | Admitting: Emergency Medicine

## 2013-09-25 ENCOUNTER — Encounter: Payer: Self-pay | Admitting: Emergency Medicine

## 2013-09-25 DIAGNOSIS — J209 Acute bronchitis, unspecified: Secondary | ICD-10-CM

## 2013-09-25 DIAGNOSIS — J0100 Acute maxillary sinusitis, unspecified: Secondary | ICD-10-CM

## 2013-09-25 MED ORDER — AZITHROMYCIN 250 MG PO TABS: ORAL_TABLET | ORAL | Status: DC

## 2013-09-25 MED ORDER — FLUTICASONE PROPIONATE 50 MCG/ACT NA SUSP: NASAL | Status: DC

## 2013-09-25 MED ORDER — PROMETHAZINE-CODEINE 6.25-10 MG/5ML PO SYRP: ORAL_SOLUTION | ORAL | Status: DC

## 2013-09-25 MED ORDER — PSEUDOEPHEDRINE-GUAIFENESIN ER 120-1200 MG PO TB12: ORAL_TABLET | ORAL | Status: DC

## 2013-09-25 NOTE — ED Provider Notes (Signed)
CSN: [id number]     Arrival date & time 09/25/13  1105 History   First MD Initiated Contact with Patient 09/25/13 1147     Chief Complaint  Patient presents with  . Cough  . Nasal Congestion  . Eye Drainage  . Headache   (Consider location/radiation/quality/duration/timing/severity/associated sxs/prior Treatment) HPI URI HISTORY  Lindsey Barker is a 52 y.o. female who complains of onset of cold symptoms for 3 days.  Have been using over-the-counter treatment, which is not helping.  Pt c/o chest and nasal congestion,  sinus HA, and cough x 3 days.    Mild chills/sweats +  Fever  +  Nasal congestion +  Discolored Post-nasal drainage Positive sinus pain/pressure maxillary headache No sore throat  +  Cough, productive of yellow sputum No wheezing Mild chest congestion No hemoptysis No shortness of breath No pleuritic pain  No itchy/red eyes No earache  No nausea No vomiting No abdominal pain No diarrhea  No skin rashes +  Fatigue No myalgias Mild sinus headache  Past Medical History  Diagnosis Date  . Anemia, unspecified 04/17/2013   Past Surgical History  Procedure Laterality Date  . Abdominal surgery  1997    c-section   Family History  Problem Relation Age of Onset  . Diabetes Father   . Hypertension Father    History  Substance Use Topics  . Smoking status: Never Smoker   . Smokeless tobacco: Not on file  . Alcohol Use: Yes     Comment: occasionally 1 drink per mth   OB History   Grav Para Term Preterm Abortions TAB SAB Ect Mult Living                 Review of Systems  All other systems reviewed and are negative.   she states that she had flu shot 3 weeks ago She denies chance of pregnancy. LMP 09/22/2013  Allergies  Tetracyclines & related  Home Medications   Current Outpatient Rx  Name  Route  Sig  Dispense  Refill  . azithromycin (ZITHROMAX Z-PAK) 250 MG tablet      Take 2 tablets on day one, then 1 tablet daily on days 2 through 5  1 each   0   . fluticasone (FLONASE) 50 MCG/ACT nasal spray      1 or 2 sprays each nostril twice a day   16 g   0   . Norethin Ace-Eth Estrad-FE (JUNEL FE 1.5/30 PO)   Oral   Take 1 tablet by mouth daily.         . promethazine-codeine (PHENERGAN WITH CODEINE) 6.25-10 MG/5ML syrup      Take 1-2 teaspoons every 6 hours as needed for cough. May cause drowsiness.   120 mL   0   . Pseudoephedrine-Guaifenesin [phone] MG TB12      Take 1 in the morning, As needed for congestion   20 each   0    BP 149/91  Pulse 93  Temp(Src) 98.3 F (36.8 C) (Oral)  Resp 18  Wt 167 lb (75.751 kg)  BMI 26.15 kg/m2  SpO2 99%  LMP 09/22/2013 Physical Exam  Nursing note and vitals reviewed. Constitutional: She is oriented to person, place, and time. She appears well-developed and well-nourished. No distress.  HENT:  Head: Normocephalic and atraumatic.  Right Ear: Tympanic membrane, external ear and ear canal normal.  Left Ear: Tympanic membrane, external ear and ear canal normal.  Nose: Mucosal edema and rhinorrhea present. Right sinus exhibits maxillary  sinus tenderness. Left sinus exhibits maxillary sinus tenderness.  Mouth/Throat: Oropharynx is clear and moist. No oral lesions. No oropharyngeal exudate.  Eyes: Right eye exhibits no discharge. Left eye exhibits no discharge. No scleral icterus.  Neck: Neck supple.  Cardiovascular: Normal rate, regular rhythm and normal heart sounds.   Pulmonary/Chest: Effort normal. She has no wheezes. She has no rales.  Mild bilateral anterior rhonchi. Good air excursion. No wheezes or rales  Lymphadenopathy:    She has no cervical adenopathy.  Neurological: She is alert and oriented to person, place, and time.  Skin: Skin is warm and dry.   Hacking cough noted  ED Course  Procedures (including critical care time) Labs Review Labs Reviewed - No data to display Imaging Review No results found.  EKG Interpretation     Ventricular Rate:     PR Interval:    QRS Duration:   QT Interval:    QTC Calculation:   R Axis:     Text Interpretation:              MDM   1. Acute maxillary sinusitis   2. Acute bronchitis    Treatment options discussed. After risks, benefits, alternatives discussed, she agrees with the following plans: Various symptomatic nonpharmacologic treatment discussed.--Rest, push fluids  Z-Pak Flonase Sudafed/guaifenesin every morning for congestion, but avoid taking if BP elevated Phenergan With Codeine, 1 or 2 teaspoons at bedtime when necessary cough Precautions discussed. Red flags discussed. Questions invited and answered. Patient voiced understanding and agreement.    Lajean Manes, MD 09/25/13 1246

## 2013-09-25 NOTE — ED Notes (Signed)
Pt c/o chest and nasal congestion, watery eyes, RT sided HA, and cough x 3 days.

## 2013-10-18 ENCOUNTER — Ambulatory Visit: Payer: BC Managed Care – PPO | Admitting: Oncology

## 2013-10-18 ENCOUNTER — Other Ambulatory Visit: Payer: BC Managed Care – PPO | Admitting: Lab

## 2013-11-04 ENCOUNTER — Other Ambulatory Visit: Payer: Self-pay | Admitting: Obstetrics and Gynecology

## 2013-11-04 NOTE — H&P (Signed)
Lindsey Barker  DICTATION # [id number] CSN# [id number]   Meriel Pica, MD 11/04/2013 1:53 PM

## 2013-11-07 NOTE — H&P (Signed)
Lindsey Barker, PARROW               ACCOUNT NO.:  [id number]  MEDICAL RECORD NO.:  [id number]  LOCATION:                                 FACILITY:  PHYSICIAN:  Duke Salvia. Marcelle Overlie, M.D.DATE OF BIRTH:  1961/10/18  DATE OF ADMISSION:  11/15/2013 DATE OF DISCHARGE:                             HISTORY & PHYSICAL   HISTORY OF CHIEF COMPLAINT:  Menorrhagia with anemia.  HISTORY OF PRESENT ILLNESS:  A 52 year old, G1, P2, currently on Gildess FE 1/20 .  This patient has had significant menorrhagia, to the point that her medical doctor has done an iron infusion and had her hemoglobin up from the 7 range up to 12.5.  Attempts in the office in doing FHT could not be accomplished due to extreme cervical stenosis.  Ultrasound October 16, 2013, demonstrated several small 2-cm intramural fibroids. She presents now for D and C, hysteroscopy.  This procedure including specific risks related to bleeding, infection, other complications such as perforation may require additional surgery discussed with her which she understands and accepts.  She will received Cytotec in the vagina the night before surgery to help with cervical dilatation.  PAST MEDICAL HISTORY:  Allergies none.  CURRENT MEDICATIONS:  Gildess FE 1/20.  PREVIOUS SURGERY:  Cesarean section 1997.  REVIEW OF SYSTEMS:  Significant for headache, otherwise negative.  FAMILY HISTORY:  Significant for diabetes.  SOCIAL HISTORY:  She is divorced.  Denies alcohol, tobacco, or drug use.  PHYSICAL EXAMINATION:  VITAL SIGNS:  Temp 98.2, blood pressure 120/82. HEENT:  Unremarkable. NECK:  Supple without masses. LUNGS:  Clear. CARDIOVASCULAR:  Regular rate and rhythm.  No murmurs, rubs, or gallops. BREASTS:  Without masses. ABDOMEN:  Soft, flat, nontender. PELVIC:  Normal external genitalia.  Vagina and cervix clear.  Uterus was 8-10 week size, irregular.  Adnexa negative. EXTREMITIES:  Unremarkable. NEUROLOGIC:   Unremarkable.  IMPRESSION:  Leiomyoma with menorrhagia, significant anemia, status post iron transfusion.  PLAN:  D and C, hysteroscopy.  Procedure and risks discussed as above.     Ivie Savitt M. Marcelle Overlie, M.D.     [facility]/MEDQ  D:  11/04/2013  T:  11/05/2013  Job:  [id number]

## 2013-11-12 ENCOUNTER — Encounter (HOSPITAL_COMMUNITY): Payer: Self-pay | Admitting: Pharmacist

## 2013-11-15 ENCOUNTER — Encounter (HOSPITAL_COMMUNITY): Payer: Self-pay | Admitting: Anesthesiology

## 2013-11-15 ENCOUNTER — Ambulatory Visit (HOSPITAL_COMMUNITY)
Admission: RE | Admit: 2013-11-15 | Discharge: 2013-11-15 | Disposition: A | Discharge status: Home / Self Care | Payer: BC Managed Care – PPO | Source: Ambulatory Visit | Attending: Obstetrics and Gynecology | Admitting: Obstetrics and Gynecology

## 2013-11-15 ENCOUNTER — Encounter (HOSPITAL_COMMUNITY): Payer: BC Managed Care – PPO | Admitting: Anesthesiology

## 2013-11-15 ENCOUNTER — Encounter (HOSPITAL_COMMUNITY)
Admission: RE | Disposition: A | Discharge status: Home / Self Care | Payer: Self-pay | Source: Ambulatory Visit | Attending: Obstetrics and Gynecology

## 2013-11-15 ENCOUNTER — Ambulatory Visit (HOSPITAL_COMMUNITY): Payer: BC Managed Care – PPO | Admitting: Anesthesiology

## 2013-11-15 DIAGNOSIS — N920 Excessive and frequent menstruation with regular cycle: Secondary | ICD-10-CM | POA: Insufficient documentation

## 2013-11-15 DIAGNOSIS — N882 Stricture and stenosis of cervix uteri: Secondary | ICD-10-CM | POA: Insufficient documentation

## 2013-11-15 DIAGNOSIS — D251 Intramural leiomyoma of uterus: Secondary | ICD-10-CM | POA: Insufficient documentation

## 2013-11-15 DIAGNOSIS — D649 Anemia, unspecified: Secondary | ICD-10-CM | POA: Insufficient documentation

## 2013-11-15 HISTORY — PX: DILATATION & CURETTAGE/HYSTEROSCOPY WITH TRUECLEAR: SHX6353

## 2013-11-15 LAB — CBC
Hemoglobin: 12.4 g/dL (ref 12.0–15.0)
MCH: 27.6 pg (ref 26.0–34.0)
MCHC: 32.8 g/dL (ref 30.0–36.0)
MCV: 84 fL (ref 78.0–100.0)
Platelets: 332 10*3/uL (ref 150–400)
RBC: 4.5 MIL/uL (ref 3.87–5.11)
RDW: 13.8 % (ref 11.5–15.5)

## 2013-11-15 LAB — PREGNANCY, URINE: Preg Test, Ur: NEGATIVE

## 2013-11-15 SURGERY — DILATATION & CURETTAGE/HYSTEROSCOPY WITH TRUCLEAR
Anesthesia: General | Site: Vagina

## 2013-11-15 MED ORDER — ONDANSETRON HCL 4 MG/2ML IJ SOLN
INTRAMUSCULAR | Status: AC
  Filled 2013-11-15: qty 2

## 2013-11-15 MED ORDER — FENTANYL CITRATE 0.05 MG/ML IJ SOLN
INTRAMUSCULAR | Status: DC | PRN
  Administered 2013-11-15 (×2): 50 ug via INTRAVENOUS

## 2013-11-15 MED ORDER — LIDOCAINE HCL 1 % IJ SOLN
INTRAMUSCULAR | Status: AC
Start: 2013-11-15 — End: 2013-11-15
  Filled 2013-11-15: qty 20

## 2013-11-15 MED ORDER — KETOROLAC TROMETHAMINE 30 MG/ML IJ SOLN
INTRAMUSCULAR | Status: AC
  Filled 2013-11-15: qty 1

## 2013-11-15 MED ORDER — HYDROCODONE-IBUPROFEN 7.5-200 MG PO TABS: 1.0000 | ORAL_TABLET | Freq: Three times a day (TID) | ORAL | Status: DC | PRN

## 2013-11-15 MED ORDER — LIDOCAINE HCL (CARDIAC) 20 MG/ML IV SOLN
INTRAVENOUS | Status: DC | PRN
  Administered 2013-11-15: 70 mg via INTRAVENOUS
  Administered 2013-11-15: 30 mg via INTRAVENOUS

## 2013-11-15 MED ORDER — LACTATED RINGERS IV SOLN
INTRAVENOUS | Status: DC
  Administered 2013-11-15: 13:00:00 via INTRAVENOUS

## 2013-11-15 MED ORDER — LIDOCAINE HCL (CARDIAC) 20 MG/ML IV SOLN
INTRAVENOUS | Status: AC
  Filled 2013-11-15: qty 5

## 2013-11-15 MED ORDER — PROPOFOL 10 MG/ML IV EMUL
INTRAVENOUS | Status: AC
  Filled 2013-11-15: qty 20

## 2013-11-15 MED ORDER — LIDOCAINE HCL 1 % IJ SOLN
INTRAMUSCULAR | Status: DC | PRN
  Administered 2013-11-15: 7 mL

## 2013-11-15 MED ORDER — MIDAZOLAM HCL 2 MG/2ML IJ SOLN
INTRAMUSCULAR | Status: AC
  Filled 2013-11-15: qty 2

## 2013-11-15 MED ORDER — PROPOFOL 10 MG/ML IV BOLUS
INTRAVENOUS | Status: DC | PRN
  Administered 2013-11-15: 180 mg via INTRAVENOUS

## 2013-11-15 MED ORDER — MIDAZOLAM HCL 2 MG/2ML IJ SOLN
INTRAMUSCULAR | Status: DC | PRN
  Administered 2013-11-15: 1 mg via INTRAVENOUS

## 2013-11-15 MED ORDER — FENTANYL CITRATE 0.05 MG/ML IJ SOLN
INTRAMUSCULAR | Status: AC
  Filled 2013-11-15: qty 2

## 2013-11-15 MED ORDER — SODIUM CHLORIDE 0.9 % IR SOLN
Status: DC | PRN
  Administered 2013-11-15: 3000 mL

## 2013-11-15 MED ORDER — KETOROLAC TROMETHAMINE 30 MG/ML IJ SOLN
INTRAMUSCULAR | Status: DC | PRN
  Administered 2013-11-15: 30 mg via INTRAVENOUS

## 2013-11-15 MED ORDER — ONDANSETRON HCL 4 MG/2ML IJ SOLN
INTRAMUSCULAR | Status: DC | PRN
  Administered 2013-11-15: 4 mg via INTRAVENOUS

## 2013-11-15 SURGICAL SUPPLY — 19 items
BLADE INCISOR TRUC PLUS 2.9 (ABLATOR) IMPLANT
CANISTERS HI-FLOW 3000CC (CANNISTER) ×10 IMPLANT
CATH ROBINSON RED A/P 16FR (CATHETERS) ×2 IMPLANT
CLOTH BEACON ORANGE TIMEOUT ST (SAFETY) ×2 IMPLANT
CONTAINER PREFILL 10% NBF 60ML (FORM) ×4 IMPLANT
DRAPE HYSTEROSCOPY (DRAPE) ×2 IMPLANT
DRSG TELFA 3X8 NADH (GAUZE/BANDAGES/DRESSINGS) ×2 IMPLANT
GLOVE BIO SURGEON STRL SZ7 (GLOVE) ×4 IMPLANT
GOWN STRL REIN XL XLG (GOWN DISPOSABLE) ×4 IMPLANT
INCISOR TRUC PLUS BLADE 2.9 (ABLATOR)
KIT HYSTEROSCOPY TRUCLEAR (ABLATOR) IMPLANT
MORCELLATOR RECIP TRUCLEAR 4.0 (ABLATOR) IMPLANT
NEEDLE SPNL 22GX3.5 QUINCKE BK (NEEDLE) ×2 IMPLANT
PACK VAGINAL MINOR WOMEN LF (CUSTOM PROCEDURE TRAY) ×2 IMPLANT
PAD OB MATERNITY 4.3X12.25 (PERSONAL CARE ITEMS) ×2 IMPLANT
SUT CHROMIC 0 UR 5 27 (SUTURE) ×2 IMPLANT
SYR CONTROL 10ML LL (SYRINGE) ×2 IMPLANT
TOWEL OR 17X24 6PK STRL BLUE (TOWEL DISPOSABLE) ×4 IMPLANT
WATER STERILE IRR 1000ML POUR (IV SOLUTION) IMPLANT

## 2013-11-15 NOTE — Anesthesia Preprocedure Evaluation (Addendum)
Anesthesia Evaluation  Patient identified by MRN, date of birth, ID band Patient awake    Reviewed: Allergy & Precautions, H&P , NPO status , Patient's Chart, lab work & pertinent test results  Airway Mallampati: II TM Distance: >3 FB Neck ROM: Full    Dental no notable dental hx. (+) Teeth Intact   Pulmonary neg pulmonary ROS,  breath sounds clear to auscultation  Pulmonary exam normal       Cardiovascular negative cardio ROS  Rhythm:Regular Rate:Normal     Neuro/Psych negative neurological ROS  negative psych ROS   GI/Hepatic negative GI ROS, Neg liver ROS,   Endo/Other  negative endocrine ROS  Renal/GU negative Renal ROS  negative genitourinary   Musculoskeletal   Abdominal   Peds  Hematology negative hematology ROS (+) anemia ,   Anesthesia Other Findings   Reproductive/Obstetrics Submucosal fibroid                          Anesthesia Physical Anesthesia Plan  ASA: II  Anesthesia Plan: General   Post-op Pain Management:    Induction: Intravenous  Airway Management Planned: LMA  Additional Equipment:   Intra-op Plan:   Post-operative Plan: Extubation in OR  Informed Consent: I have reviewed the patients History and Physical, chart, labs and discussed the procedure including the risks, benefits and alternatives for the proposed anesthesia with the patient or authorized representative who has indicated his/her understanding and acceptance.   Dental advisory given  Plan Discussed with: CRNA, Anesthesiologist and Surgeon  Anesthesia Plan Comments:         Anesthesia Quick Evaluation

## 2013-11-15 NOTE — Anesthesia Postprocedure Evaluation (Signed)
  Anesthesia Post-op Note  Patient: Lindsey Barker  Procedure(s) Performed: Procedure(s): DILATATION & CURETTAGE/HYSTEROSCOPY WITH TRUCLEAR (N/A) Patient is awake and responsive. Pain and nausea are reasonably well controlled. Vital signs are stable and clinically acceptable. Oxygen saturation is clinically acceptable. There are no apparent anesthetic complications at this time. Patient is ready for discharge.

## 2013-11-15 NOTE — Transfer of Care (Signed)
Immediate Anesthesia Transfer of Care Note  Patient: Lindsey Barker  Procedure(s) Performed: Procedure(s): DILATATION & CURETTAGE/HYSTEROSCOPY WITH TRUCLEAR (N/A)  Patient Location: PACU  Anesthesia Type:General  Level of Consciousness: awake, oriented and patient cooperative  Airway & Oxygen Therapy: Patient Spontanous Breathing and Patient connected to nasal cannula oxygen  Post-op Assessment: Report given to PACU RN and Post -op Vital signs reviewed and stable  Post vital signs: Reviewed and stable  Complications: No apparent anesthesia complications

## 2013-11-15 NOTE — Op Note (Signed)
Preoperative diagnosis: Perimenopausal bleeding, history of fibroids  Postoperative diagnosis: Same  Procedure: D&C hysteroscopy  Surgeon: Marcelle Overlie  Anesthesia: Gen.  Specimens removed: Endometrial curettings, to pathology  Suture and findings:  The patient taken the operating room after an adequate level of anesthesia was obtained with the patient's legs in stirrups the perineum and vagina were prepped and draped in the usual fashion for D&C the bladder was drained. Uterus was 8 weeks size irregular adnexa negative. Appropriate timeout for taken prior to prepping.  Speculum was positioned paracervical block was then created by infiltrating at 3 and 9:00 submucosally 5-7 cc of 1% Xylocaine at each site after negative aspiration. The uterus sounded to 9 cm progressively dilated to a 27 Pratt. The continuous flow hysteroscope was inserted without difficulty, good visualization of both tubal ostia the endometrium was pale and thin she is currently on OCPs there was no unusual buildup of tissue nor abnormalities specifically no polyps or submucous fibroids noted.  Sharp curettage was carried out minimal tissue sent to pathology she tolerated this well went to recovery room in good condition.  Dictated with [facility]  Demetry Bendickson M. Milana Obey.D.

## 2013-11-18 ENCOUNTER — Encounter (HOSPITAL_COMMUNITY): Payer: Self-pay | Admitting: Obstetrics and Gynecology

## 2015-10-12 ENCOUNTER — Emergency Department (INDEPENDENT_AMBULATORY_CARE_PROVIDER_SITE_OTHER)
Admission: EM | Admit: 2015-10-12 | Discharge: 2015-10-12 | Disposition: A | Discharge status: Home / Self Care | Payer: BC Managed Care – PPO | Source: Home / Self Care | Attending: Family Medicine | Admitting: Family Medicine

## 2015-10-12 ENCOUNTER — Encounter: Payer: Self-pay | Admitting: *Deleted

## 2015-10-12 DIAGNOSIS — R030 Elevated blood-pressure reading, without diagnosis of hypertension: Secondary | ICD-10-CM | POA: Diagnosis not present

## 2015-10-12 DIAGNOSIS — R519 Headache, unspecified: Secondary | ICD-10-CM

## 2015-10-12 DIAGNOSIS — R51 Headache: Secondary | ICD-10-CM | POA: Diagnosis not present

## 2015-10-12 DIAGNOSIS — R110 Nausea: Secondary | ICD-10-CM

## 2015-10-12 DIAGNOSIS — IMO0001 Reserved for inherently not codable concepts without codable children: Secondary | ICD-10-CM

## 2015-10-12 MED ORDER — ONDANSETRON HCL 4 MG PO TABS: 4.0000 mg | ORAL_TABLET | Freq: Four times a day (QID) | ORAL | Status: DC

## 2015-10-12 MED ORDER — DEXAMETHASONE SODIUM PHOSPHATE 10 MG/ML IJ SOLN
10.0000 mg | Freq: Once | INTRAMUSCULAR | Status: AC
  Administered 2015-10-12: 10 mg via INTRAMUSCULAR

## 2015-10-12 MED ORDER — ONDANSETRON 4 MG PO TBDP
4.0000 mg | ORAL_TABLET | Freq: Once | ORAL | Status: AC
  Administered 2015-10-12: 4 mg via ORAL

## 2015-10-12 MED ORDER — ASPIRIN-ACETAMINOPHEN-CAFFEINE 250-250-65 MG PO TABS: ORAL_TABLET | ORAL | Status: DC

## 2015-10-12 MED ORDER — KETOROLAC TROMETHAMINE 60 MG/2ML IM SOLN
60.0000 mg | Freq: Once | INTRAMUSCULAR | Status: AC
  Administered 2015-10-12: 60 mg via INTRAMUSCULAR

## 2015-10-12 NOTE — ED Provider Notes (Signed)
CSN: [id number]     Arrival date & time 10/12/15  1019 History   First MD Initiated Contact with Patient 10/12/15 1032     Chief Complaint  Patient presents with  . Headache   (Consider location/radiation/quality/duration/timing/severity/associated sxs/prior Treatment) HPI  Pt is a 54yo female presenting to [facility] with c/o gradually worsening Right sided headache that started last night.  Pt states HA feels similar to prior headaches. Pain is aching and throbbing, worse with light and sound. Pain is 9/10 at worst. Associated nausea but no vomiting. She did take 500mg  Tylenol today at 6AM w/o relief.  Pt was seen at [facility] for similar symptoms in 20-12. At that time, her headaches were associated with her menstrual cycle.  Pt states her last menstrual cycle was 1 month ago but notes it is sporadic.  Pt does report being under increased stress at home and notes she did not get much sleep last night.  Denies dizziness or change in vision. Denies change in balance. Denies neck pain or stiffness.  Denies numbness or tingling in arms or legs. Denies recent illness with cough, congestion, sore throat, fever or chills. Denies recent head injuries.   BP elevated in triage. Pt denies hx of HTN. Denies chest pain or SOB.   Past Medical History  Diagnosis Date  . Anemia, unspecified 04/17/2013   Past Surgical History  Procedure Laterality Date  . Abdominal surgery  1997    c-section  . Dilatation & curettage/hysteroscopy with trueclear N/A 11/15/2013    Procedure: DILATATION & CURETTAGE/HYSTEROSCOPY WITH TRUCLEAR;  Surgeon: Margarette Asal, MD;  Location: [facility];  Service: Gynecology;  Laterality: N/A;   Family History  Problem Relation Age of Onset  . Diabetes Father   . Hypertension Father    Social History  Substance Use Topics  . Smoking status: Never Smoker   . Smokeless tobacco: None  . Alcohol Use: Yes     Comment: occasionally 1 drink per mth   OB History    No data available      Review of Systems  Constitutional: Negative for fever and chills.  HENT: Negative for congestion, ear pain and sore throat.   Eyes: Positive for photophobia. Negative for pain and visual disturbance.  Gastrointestinal: Positive for nausea. Negative for vomiting, abdominal pain and diarrhea.  Musculoskeletal: Negative for myalgias, back pain, neck pain and neck stiffness.  Neurological: Positive for headaches. Negative for dizziness and light-headedness.    Allergies  Tetracyclines & related  Home Medications   Prior to Admission medications   Medication Sig Start Date End Date Taking? Authorizing Provider  aspirin-acetaminophen-caffeine [facility] MIGRAINE) [id number] MG tablet Take 1-2 tabs every 6-8 hours for headache 10/12/15   Noland Fordyce, PA-C  ondansetron (ZOFRAN) 4 MG tablet Take 1 tablet (4 mg total) by mouth every 6 (six) hours. 10/12/15   Noland Fordyce, PA-C   Meds Ordered and Administered this Visit   Medications  ketorolac (TORADOL) injection 60 mg (60 mg Intramuscular Given 10/12/15 1124)  ondansetron (ZOFRAN-ODT) disintegrating tablet 4 mg (4 mg Oral Given 10/12/15 1124)  dexamethasone (DECADRON) injection 10 mg (10 mg Intramuscular Given 10/12/15 1124)    BP 144/90 mmHg  Pulse 77  Temp(Src) 97.7 F (36.5 C) (Oral)  Resp 16  Wt 180 lb (81.647 kg)  SpO2 100%  LMP  (LMP Unknown) No data found.   Physical Exam  Constitutional: She is oriented to person, place, and time. She appears well-developed and well-nourished. No distress.  HENT:  Head: Normocephalic and atraumatic.  Nose: Nose normal.  Mouth/Throat: Uvula is midline, oropharynx is clear and moist and mucous membranes are normal.  No tenderness over temporal arteries.   Eyes: Conjunctivae and EOM are normal. Pupils are equal, round, and reactive to light. No scleral icterus.  Neck: Normal range of motion. Neck supple.  No nuchal rigidity or meningeal signs.  Cardiovascular: Normal rate, regular  rhythm and normal heart sounds.   Pulmonary/Chest: Effort normal and breath sounds normal. No respiratory distress. She has no wheezes. She has no rales. She exhibits no tenderness.  Abdominal: Soft. She exhibits no distension and no mass. There is no tenderness. There is no rebound and no guarding.  Musculoskeletal: Normal range of motion.  Neurological: She is alert and oriented to person, place, and time. She has normal strength. No cranial nerve deficit or sensory deficit. She displays a negative Romberg sign. Coordination and gait normal. GCS eye subscore is 4. GCS verbal subscore is 5. GCS motor subscore is 6.  CN II-XII in tact. Speech is clear. Alert to person, place, and time. Normal gait.  Skin: Skin is warm and dry. She is not diaphoretic.  Nursing note and vitals reviewed.   ED Course  Procedures (including critical care time)  Labs Review Labs Reviewed - No data to display  Imaging Review No results found.    MDM   1. Right-sided headache   2. Nausea without vomiting   3. Elevated blood pressure    Pt c/o headache, similar to prior. HA was gradual in onset. No meningeal signs. Pt is afebrile. BP elevated: 169/99  Pt medical records reviewed. HA in 2012 improved with IM Toradol 60mg .  Tx in UC: Toradol 60mg  IM, Decradron 10mg  IM, Zofran 8mg  PO Pt allowed to rest in cool dark exam room.  Pt stated HA and nausea improved significantly.  No vomiting in UC.  BP improved to 144/90. Pain improved from 9/10 to 5/10. Pt states she feels comfortable being discharged home.  Rx: Excedrin and Zofran. Home care instructions provided. Encouraged to rest today in a cool dark room and stay well hydrated. F/u with PCP as needed. Discussed symptoms that warrant emergent care in the ED. Pt's boyfriend here to drive pt home. Patient verbalized understanding and agreement with treatment plan.     Noland Fordyce, PA-C 10/12/15 [phone]

## 2015-10-12 NOTE — ED Notes (Signed)
Pt c/o HA that started last night. Taken 1 tylenol @ 6am. C/o nausea.

## 2015-10-12 NOTE — ED Notes (Signed)
BP at discharge 144/90, HR 77 Pain 5/10

## 2015-10-12 NOTE — Discharge Instructions (Signed)
It is best to take it easy today.  Rest in a cool dark room.  Be sure to stay well hydrated.  Follow up with your primary care provider for recurrent headaches and recheck of your blood pressure next week.

## 2017-06-22 ENCOUNTER — Emergency Department
Admission: EM | Admit: 2017-06-22 | Discharge: 2017-06-22 | Disposition: A | Discharge status: Home / Self Care | Payer: BC Managed Care – PPO | Source: Home / Self Care | Attending: Family Medicine | Admitting: Family Medicine

## 2017-06-22 DIAGNOSIS — R112 Nausea with vomiting, unspecified: Secondary | ICD-10-CM

## 2017-06-22 DIAGNOSIS — G43009 Migraine without aura, not intractable, without status migrainosus: Secondary | ICD-10-CM | POA: Diagnosis not present

## 2017-06-22 MED ORDER — KETOROLAC TROMETHAMINE 60 MG/2ML IM SOLN
60.0000 mg | Freq: Once | INTRAMUSCULAR | Status: AC
  Administered 2017-06-22: 60 mg via INTRAMUSCULAR

## 2017-06-22 MED ORDER — ONDANSETRON 4 MG PO TBDP: ORAL_TABLET | ORAL | 0 refills | Status: DC

## 2017-06-22 MED ORDER — METOCLOPRAMIDE HCL 5 MG/ML IJ SOLN
5.0000 mg | Freq: Once | INTRAMUSCULAR | Status: AC
  Administered 2017-06-22: 5 mg via INTRAMUSCULAR

## 2017-06-22 MED ORDER — DEXAMETHASONE SODIUM PHOSPHATE 10 MG/ML IJ SOLN
10.0000 mg | Freq: Once | INTRAMUSCULAR | Status: AC
  Administered 2017-06-22: 10 mg via INTRAMUSCULAR

## 2017-06-22 NOTE — ED Triage Notes (Signed)
Pt stated that migraine started last night.  Photosensitivity, has take 1 "[facility] headache medication".

## 2017-06-22 NOTE — ED Provider Notes (Signed)
[name]    CSN: [id number] Arrival date & time: 06/22/17  1659     History   Chief Complaint Chief Complaint  Patient presents with  . Migraine    with nausea    HPI Lindsey Barker is a 56 y.o. female.   Patient complains of onset of typical migraine headache last night.  She complains of nausea and photosensitivity.  Her headache has not responded to OTC medication.  She states that she gets about 3 migraines per month.   The history is provided by the patient.  Migraine  This is a recurrent problem. The current episode started yesterday. The problem occurs constantly. The problem has been gradually worsening. Associated symptoms comments: nausea. The symptoms are aggravated by walking (light). Nothing relieves the symptoms. Treatments tried: OTC medication. The treatment provided no relief.    Past Medical History:  Diagnosis Date  . Anemia, unspecified 04/17/2013    Patient Active Problem List   Diagnosis Date Noted  . Anemia 04/17/2013    Past Surgical History:  Procedure Laterality Date  . ABDOMINAL SURGERY  1997   c-section  . DILATATION & CURETTAGE/HYSTEROSCOPY WITH TRUECLEAR N/A 11/15/2013   Procedure: DILATATION & CURETTAGE/HYSTEROSCOPY WITH TRUCLEAR;  Surgeon: Margarette Asal, MD;  Location: [facility];  Service: Gynecology;  Laterality: N/A;    OB History    No data available       Home Medications    Prior to Admission medications   Medication Sig Start Date End Date Taking? Authorizing Provider  aspirin-acetaminophen-caffeine (EXCEDRIN MIGRAINE) [phone] MG tablet Take 1-2 tabs every 6-8 hours for headache 10/12/15   Leeroy Cha O, PA-C  ondansetron (ZOFRAN ODT) 4 MG disintegrating tablet Take one tab by mouth Q6hr prn nausea.  Dissolve under tongue. 06/22/17   Kandra Nicolas, MD    Family History Family History  Problem Relation Age of Onset  . Diabetes Father   . Hypertension Father     Social History Social History    Substance Use Topics  . Smoking status: Never Smoker  . Smokeless tobacco: Not on file  . Alcohol use Yes     Comment: occasionally 1 drink per mth     Allergies   Tetracyclines & related   Review of Systems Review of Systems  All other systems reviewed and are negative.    Physical Exam Triage Vital Signs ED Triage Vitals  Enc Vitals Group     BP 06/22/17 1715 (!) 166/105     Pulse Rate 06/22/17 1715 93     Resp --      Temp 06/22/17 1715 (!) 97.5 F (36.4 C)     Temp Source 06/22/17 1715 Oral     SpO2 06/22/17 1715 100 %     Weight 06/22/17 1716 170 lb (77.1 kg)     Height 06/22/17 1716 5\' 7"  (1.702 m)     Head Circumference --      Peak Flow --      Pain Score 06/22/17 1716 8     Pain Loc --      Pain Edu? --      Excl. in [place]? --    No data found.   Updated Vital Signs BP (!) 166/105 (BP Location: Left Arm)   Pulse 93   Temp (!) 97.5 F (36.4 C) (Oral)   Ht 5\' 7"  (1.702 m)   Wt 170 lb (77.1 kg)   LMP  (LMP Unknown) Comment: peri-menopause  SpO2 100%  BMI 26.63 kg/m   Visual Acuity Right Eye Distance:   Left Eye Distance:   Bilateral Distance:    Right Eye Near:   Left Eye Near:    Bilateral Near:     Physical Exam Nursing notes and Vital Signs reviewed. Appearance:  Patient appears stated age, and uncomfortable, but in no acute distress Eyes:  Pupils are equal, round, and reactive to light and accomodation.  Extraocular movement is intact.  Conjunctivae are not inflamed.  Fundi benign.  Mild photophobia. Ears:  Canals normal.  Tympanic membranes normal.  Nose:  Normal turbinates.  No sinus tenderness.    Pharynx:  Normal Neck:  Supple.  No adenopathy.  Lungs:  Clear to auscultation.  Breath sounds are equal.  Moving air well. Heart:  Regular rate and rhythm without murmurs, rubs, or gallops.  Abdomen:  Nontender without masses or hepatosplenomegaly.  Bowel sounds are present.  No CVA or flank tenderness.  Extremities:  No edema.  Skin:   No rash present.  Neurologic:  Cranial nerves 2 through 12 are normal.  Patellar, achilles, and elbow reflexes are normal.  Cerebellar function is intact (finger-to-nose and rapid alternating hand movement).  Gait and station are normal.  Romberg negative.    UC Treatments / Results  Labs (all labs ordered are listed, but only abnormal results are displayed) Labs Reviewed - No data to display  EKG  EKG Interpretation None       Radiology No results found.  Procedures Procedures (including critical care time)  Medications Ordered in UC Medications  ketorolac (TORADOL) injection 60 mg (not administered)  dexamethasone (DECADRON) injection 10 mg (not administered)  metoCLOPramide (REGLAN) injection 5 mg (not administered)     Initial Impression / Assessment and Plan / UC Course  I have reviewed the triage vital signs and the nursing notes.  Pertinent labs & imaging results that were available during my care of the patient were reviewed by me and considered in my medical decision making (see chart for details).    Administered Toradol 60mg  IM.  Administered Decadron 10mg  IM.  Administered Reglan 5mg  IM.  Given Rx for Zofran ODT 4mg . Rest.  Maintain adequate fluid intake. If symptoms become significantly worse during the night or over the weekend, proceed to the local emergency room.  Followup with Family Doctor for headache management.  Final Clinical Impressions(s) / UC Diagnoses   Final diagnoses:  Migraine without aura and without status migrainosus, not intractable  Non-intractable vomiting with nausea, unspecified vomiting type    New Prescriptions New Prescriptions   ONDANSETRON (ZOFRAN ODT) 4 MG DISINTEGRATING TABLET    Take one tab by mouth Q6hr prn nausea.  Dissolve under tongue.     Kandra Nicolas, MD 07/04/17 1116

## 2017-06-22 NOTE — Discharge Instructions (Signed)
Rest.  Maintain adequate fluid intake. If symptoms become significantly worse during the night or over the weekend, proceed to the local emergency room.

## 2018-03-21 ENCOUNTER — Emergency Department (INDEPENDENT_AMBULATORY_CARE_PROVIDER_SITE_OTHER): Payer: BC Managed Care – PPO

## 2018-03-21 ENCOUNTER — Emergency Department
Admission: EM | Admit: 2018-03-21 | Discharge: 2018-03-21 | Disposition: A | Discharge status: Home / Self Care | Payer: BC Managed Care – PPO | Source: Home / Self Care | Attending: Emergency Medicine | Admitting: Emergency Medicine

## 2018-03-21 ENCOUNTER — Other Ambulatory Visit: Payer: Self-pay

## 2018-03-21 DIAGNOSIS — M25572 Pain in left ankle and joints of left foot: Secondary | ICD-10-CM

## 2018-03-21 DIAGNOSIS — M7989 Other specified soft tissue disorders: Secondary | ICD-10-CM | POA: Diagnosis not present

## 2018-03-21 MED ORDER — CEPHALEXIN 500 MG PO CAPS: 500.0000 mg | ORAL_CAPSULE | Freq: Three times a day (TID) | ORAL | 0 refills | Status: DC

## 2018-03-21 MED ORDER — DICLOFENAC SODIUM 3 % TD GEL: TRANSDERMAL | 1 refills | Status: DC

## 2018-03-21 NOTE — ED Provider Notes (Signed)
[facility]    CSN: [id number] Arrival date & time: 03/21/18  1710     History   Chief Complaint Chief Complaint  Patient presents with  . Foot Swelling    Left   Patient enters with pain and swelling of the left ankle.  She has no specific injury.  She is currently unable to do steps because of the pain.  She has not been sick with fever or chills.  She denies any trauma to her ankle.  She states the pain now shoots up her left ankle to her left shin.  She denies calf pain.  She is under treatment for onychomycosis. HPI Lindsey Barker is a 57 y.o. female.   HPI  Past Medical History:  Diagnosis Date  . Anemia, unspecified 04/17/2013    Patient Active Problem List   Diagnosis Date Noted  . Anemia 04/17/2013    Past Surgical History:  Procedure Laterality Date  . ABDOMINAL SURGERY  1997   c-section  . DILATATION & CURETTAGE/HYSTEROSCOPY WITH TRUECLEAR N/A 11/15/2013   Procedure: DILATATION & CURETTAGE/HYSTEROSCOPY WITH TRUCLEAR;  Surgeon: Margarette Asal, MD;  Location: [facility];  Service: Gynecology;  Laterality: N/A;    OB History   None      Home Medications    Prior to Admission medications   Medication Sig Start Date End Date Taking? Authorizing Provider  aspirin-acetaminophen-caffeine [facility] MIGRAINE) [phone] MG tablet Take 1-2 tabs every 6-8 hours for headache 10/12/15   Noe Gens, PA-C  cephALEXin (KEFLEX) 500 MG capsule Take 1 capsule (500 mg total) by mouth 3 (three) times daily. 03/21/18   Darlyne Russian, MD  Diclofenac Sodium 3 % GEL Apply 2 g to your ankle 3 times a day. 03/21/18   Darlyne Russian, MD  ondansetron (ZOFRAN ODT) 4 MG disintegrating tablet Take one tab by mouth Q6hr prn nausea.  Dissolve under tongue. 06/22/17   Kandra Nicolas, MD    Family History Family History  Problem Relation Age of Onset  . Diabetes Father   . Hypertension Father     Social History Social History   Tobacco Use  . Smoking status:  Never Smoker  . Smokeless tobacco: Never Used  Substance Use Topics  . Alcohol use: Yes    Comment: occasionally 1 drink per mth  . Drug use: No     Allergies   Tetracyclines & related   Review of Systems Review of Systems  Constitutional: Negative for fever.  Musculoskeletal:       Severe left ankle pain.  She rates her pain 6-7 out of 10.     Physical Exam Triage Vital Signs ED Triage Vitals  Enc Vitals Group     BP 03/21/18 1730 (!) 146/103     Pulse Rate 03/21/18 1730 (!) 101     Resp 03/21/18 1730 18     Temp 03/21/18 1730 97.6 F (36.4 C)     Temp Source 03/21/18 1730 Oral     SpO2 03/21/18 1730 97 %     Weight 03/21/18 1731 190 lb (86.2 kg)     Height 03/21/18 1731 5\' 7"  (1.702 m)     Head Circumference --      Peak Flow --      Pain Score 03/21/18 1731 0     Pain Loc --      Pain Edu? --      Excl. in [place]? --    No data found.  Updated  Vital Signs BP (!) 146/103 (BP Location: Right Arm)   Pulse (!) 101   Temp 97.6 F (36.4 C) (Oral)   Resp 18   Ht 5\' 7"  (1.702 m)   Wt 190 lb (86.2 kg)   LMP  (LMP Unknown) Comment: peri-menopause  SpO2 97%   BMI 29.76 kg/m   Visual Acuity Right Eye Distance:   Left Eye Distance:   Bilateral Distance:    Right Eye Near:   Left Eye Near:    Bilateral Near:     Physical Exam  Constitutional: She is oriented to person, place, and time. She appears well-developed and well-nourished.  Musculoskeletal:  Examination of the left ankle reveals very limited flexion extension as well as inversion eversion.  The ankle is not warm but does appear swollen.  There is no calf tenderness.  Dorsalis pedis and posterior tibial pulses are 2+.  Neurological: She is alert and oriented to person, place, and time.  Skin: Skin is warm and dry.     UC Treatments / Results  Labs (all labs ordered are listed, but only abnormal results are displayed) Labs Reviewed  SEDIMENTATION RATE  URIC ACID  RHEUMATOID FACTOR  CBC WITH  DIFFERENTIAL/PLATELET  POCT CBC W AUTO DIFF ([place])    EKG None Radiology Dg Ankle Complete Left  Result Date: 03/21/2018 CLINICAL DATA:  Left ankle pain with swelling x1 week. No known injury. EXAM: LEFT ANKLE COMPLETE - 3+ VIEW COMPARISON:  None. FINDINGS: Moderate soft tissue swelling is noted overlying the lateral malleolus. Lesser degree of soft tissue edema suggested of the included leg. No ankle joint effusion. The tibiotalar, subtalar and midfoot articulations are congruent. No fracture or suspicious osseous lesions. IMPRESSION: Nonspecific soft tissue edema and swelling of the leg and ankle as above. Findings could be related to venous insufficiency or potentially a cellulitis in the absence of known trauma among some differential possibilities. No acute osseous appearing abnormality. Electronically Signed   By: Ashley Royalty M.D.   On: 03/21/2018 18:36    Procedures Procedures (including critical care time)  Medications Ordered in UC Medications - No data to display   Initial Impression / Assessment and Plan / UC Course  I have reviewed the triage vital signs and the nursing notes.  Pertinent labs & imaging results that were available during my care of the patient were reviewed by me and considered in my medical decision making (see chart for details).     Patient has a monoarticular arthritis.  This could be gout.  Could be rheumatoid disease.  Her blood pressure is elevated.  We will go ahead and check films.  X-ray is read with nonspecific findings consistent with possible venous insufficiency or even a cellulitis.  We will schedule patient for a calf ultrasound.  She will have her blood work done in the morning.  She will try and schedule with sports medicine next door.  We were not able to get her blood work done.  We will need to see what her CBC and sed rate show to help in defining what is going on with her leg.  She will be treated with antibiotics and  diclofenac gel.  We will have patient change over to over-the-counter Capzasin gel until we get her diclofenac approved.  Final Clinical Impressions(s) / UC Diagnoses   Final diagnoses:  Ankle pain, left    ED Discharge Orders        Ordered    Diclofenac Sodium 3 %  GEL     03/21/18 1917    cephALEXin (KEFLEX) 500 MG capsule  3 times daily     03/21/18 1917     Apply diclofenac gel 3 times a day. Return in the morning for your blood work. Try and make an appointment at the family medicine office to see 1 of the sports medicine doctors. We are going to schedule you for an ultrasound of your leg. Take your antibiotics 3 times a day.  Controlled Substance Prescriptions [place] Controlled Substance Registry consulted? Not Applicable   Darlyne Russian, MD 03/21/18 [id number]

## 2018-03-21 NOTE — ED Triage Notes (Signed)
Pt c/o L foot swelling since last Thursday. No injury known, just started at random. Some shooting pain up calf muscle. Tried ibuprofen, ice and elevation with no relief.

## 2018-03-21 NOTE — Discharge Instructions (Addendum)
Apply diclofenac gel 3 times a day. Return in the morning for your blood work. Try and make an appointment at the family medicine office to see 1 of the sports medicine doctors. We are going to schedule you for an ultrasound of your leg. Take your antibiotics 3 times a day.

## 2018-03-22 ENCOUNTER — Telehealth: Payer: Self-pay | Admitting: *Deleted

## 2018-03-22 ENCOUNTER — Ambulatory Visit: Payer: BC Managed Care – PPO

## 2018-03-22 LAB — CBC WITH DIFFERENTIAL/PLATELET
Basophils Absolute: 20 cells/uL (ref 0–200)
Basophils Relative: 0.4 %
EOS PCT: 1.2 %
Eosinophils Absolute: 61 cells/uL (ref 15–500)
HCT: 42 % (ref 35.0–45.0)
Hemoglobin: 14 g/dL (ref 11.7–15.5)
Lymphs Abs: 2157 cells/uL (ref 850–3900)
MCH: 28.7 pg (ref 27.0–33.0)
MCHC: 33.3 g/dL (ref 32.0–36.0)
MCV: 86.1 fL (ref 80.0–100.0)
MONOS PCT: 7.8 %
MPV: 10.5 fL (ref 7.5–12.5)
NEUTROS PCT: 48.3 %
Neutro Abs: 2463 cells/uL (ref 1500–7800)
PLATELETS: 269 10*3/uL (ref 140–400)
RBC: 4.88 10*6/uL (ref 3.80–5.10)
RDW: 13 % (ref 11.0–15.0)
TOTAL LYMPHOCYTE: 42.3 %
WBC mixed population: 398 cells/uL (ref 200–950)
WBC: 5.1 10*3/uL (ref 3.8–10.8)

## 2018-03-22 LAB — URIC ACID: URIC ACID, SERUM: 4.8 mg/dL (ref 2.5–7.0)

## 2018-03-22 LAB — SEDIMENTATION RATE: SED RATE: 22 mm/h (ref 0–30)

## 2018-03-22 NOTE — Telephone Encounter (Signed)
Spoke to Lindsey Barker given [place] and lab results. Dr Everlene Farrier recommend that she f/u with sports medicine. Lindsey Barker verbalized understanding.

## 2018-03-23 LAB — RHEUMATOID FACTOR: Rhuematoid fact SerPl-aCnc: <14 IU/mL (ref <14)

## 2018-11-06 ENCOUNTER — Other Ambulatory Visit: Payer: Self-pay | Admitting: Obstetrics and Gynecology

## 2018-11-06 DIAGNOSIS — Z1231 Encounter for screening mammogram for malignant neoplasm of breast: Secondary | ICD-10-CM

## 2018-11-07 ENCOUNTER — Ambulatory Visit: Payer: BC Managed Care – PPO

## 2018-11-07 DIAGNOSIS — Z1231 Encounter for screening mammogram for malignant neoplasm of breast: Secondary | ICD-10-CM

## 2018-12-10 ENCOUNTER — Emergency Department
Admission: EM | Admit: 2018-12-10 | Discharge: 2018-12-10 | Disposition: A | Discharge status: Home / Self Care | Payer: BC Managed Care – PPO | Source: Home / Self Care | Attending: Family Medicine | Admitting: Family Medicine

## 2018-12-10 ENCOUNTER — Other Ambulatory Visit: Payer: Self-pay

## 2018-12-10 DIAGNOSIS — B9789 Other viral agents as the cause of diseases classified elsewhere: Secondary | ICD-10-CM | POA: Diagnosis not present

## 2018-12-10 DIAGNOSIS — B309 Viral conjunctivitis, unspecified: Secondary | ICD-10-CM

## 2018-12-10 DIAGNOSIS — J069 Acute upper respiratory infection, unspecified: Secondary | ICD-10-CM | POA: Diagnosis not present

## 2018-12-10 MED ORDER — AMOXICILLIN 875 MG PO TABS: 875.0000 mg | ORAL_TABLET | Freq: Two times a day (BID) | ORAL | 0 refills | Status: DC

## 2018-12-10 MED ORDER — BENZONATATE 200 MG PO CAPS: ORAL_CAPSULE | ORAL | 0 refills | Status: DC

## 2018-12-10 NOTE — Discharge Instructions (Addendum)
Take plain guaifenesin (1200mg  extended release tabs such as Mucinex) twice daily, with plenty of water, for cough and congestion.   Get adequate rest.   May use Afrin nasal spray (or generic oxymetazoline) each morning for about 5 days and then discontinue.  Also recommend using saline nasal spray several times daily and saline nasal irrigation (AYR is a common brand).  Use Flonase nasal spray each morning after using Afrin nasal spray and saline nasal irrigation. Try warm salt water gargles for sore throat.  Stop all antihistamines for now, and other non-prescription cough/cold preparations. May take Delsym Cough Suppressant with Tessalon at bedtime for nighttime cough.  Begin Amoxicillin if not improving about one week or if persistent fever develops. Try using chilled lubricating eye drops for eye irritation.

## 2018-12-10 NOTE — ED Triage Notes (Signed)
Pt has had cold sx since Friday.  Mostly clear mucous, and sore throat.

## 2018-12-10 NOTE — ED Provider Notes (Signed)
[name]    CSN: [id number] Arrival date & time: 12/10/18  1634     History   Chief Complaint Chief Complaint  Patient presents with  . Sore Throat  . Nasal Congestion    HPI Lindsey Barker is a 58 y.o. female.   Patient complains of four day history of typical cold-like symptoms developing over several days, including mild sore throat, sinus congestion, fatigue, and mild cough. She is concerned about redness in her left eye.  She denies pain, itching or burning in the left eye, and no foreign body sensation.  The history is provided by the patient.    Past Medical History:  Diagnosis Date  . Anemia, unspecified 04/17/2013    Patient Active Problem List   Diagnosis Date Noted  . Anemia 04/17/2013    Past Surgical History:  Procedure Laterality Date  . ABDOMINAL SURGERY  1997   c-section  . DILATATION & CURETTAGE/HYSTEROSCOPY WITH TRUECLEAR N/A 11/15/2013   Procedure: DILATATION & CURETTAGE/HYSTEROSCOPY WITH TRUCLEAR;  Surgeon: Margarette Asal, MD;  Location: [facility];  Service: Gynecology;  Laterality: N/A;    OB History   No obstetric history on file.      Home Medications    Prior to Admission medications   Medication Sig Start Date End Date Taking? Authorizing Provider  amoxicillin (AMOXIL) 875 MG tablet Take 1 tablet (875 mg total) by mouth 2 (two) times daily. (Rx void after 12/18/18) 12/10/18   Kandra Nicolas, MD  aspirin-acetaminophen-caffeine [facility] MIGRAINE) [phone] MG tablet Take 1-2 tabs every 6-8 hours for headache 10/12/15   Noe Gens, PA-C  benzonatate (TESSALON) 200 MG capsule Take one cap by mouth at bedtime as needed for cough.  May repeat in 4 to 6 hours 12/10/18   Kandra Nicolas, MD  Diclofenac Sodium 3 % GEL Apply 2 g to your ankle 3 times a day. 03/21/18   Darlyne Russian, MD  ondansetron (ZOFRAN ODT) 4 MG disintegrating tablet Take one tab by mouth Q6hr prn nausea.  Dissolve under tongue. 06/22/17   Kandra Nicolas, MD    Family History Family History  Problem Relation Age of Onset  . Diabetes Father   . Hypertension Father     Social History Social History   Tobacco Use  . Smoking status: Never Smoker  . Smokeless tobacco: Never Used  Substance Use Topics  . Alcohol use: Yes    Comment: occasionally 1 drink per mth  . Drug use: No     Allergies   Tetracyclines & related   Review of Systems Review of Systems + sore throat + cough No pleuritic pain No wheezing + nasal congestion + post-nasal drainage No sinus pain/pressure + itchy/red left eye No earache No hemoptysis No SOB No fever, + chills No nausea No vomiting No abdominal pain No diarrhea No urinary symptoms No skin rash + fatigue No myalgias No headache Used OTC meds without relief   Physical Exam Triage Vital Signs ED Triage Vitals  Enc Vitals Group     BP 12/10/18 1657 (!) 157/109     Pulse Rate 12/10/18 1657 72     Resp 12/10/18 1657 20     Temp 12/10/18 1657 98.2 F (36.8 C)     Temp Source 12/10/18 1657 Oral     SpO2 12/10/18 1657 99 %     Weight 12/10/18 1658 193 lb (87.5 kg)     Height 12/10/18 1658 5\' 6"  (1.676 m)  Head Circumference --      Peak Flow --      Pain Score 12/10/18 1657 5     Pain Loc --      Pain Edu? --      Excl. in [place]? --    No data found.  Updated Vital Signs BP (!) 157/109 (BP Location: Right Arm)   Pulse 72   Temp 98.2 F (36.8 C) (Oral)   Resp 20   Ht 5\' 6"  (1.676 m)   Wt 87.5 kg   LMP  (LMP Unknown) Comment: peri-menopause  SpO2 99%   BMI 31.15 kg/m   Visual Acuity Right Eye Distance:   Left Eye Distance:   Bilateral Distance:    Right Eye Near:   Left Eye Near:    Bilateral Near:     Physical Exam Nursing notes and Vital Signs reviewed. Appearance:  Patient appears stated age, and in no acute distress Eyes:  Pupils are equal, round, and reactive to light and accomodation.  Extraocular movement is intact. Minimal injection of conjunctivae  left eye.  Right conjunctivae normal. Ears:  Canals normal.  Tympanic membranes normal.  Nose:  Mildly congested turbinates.  No sinus tenderness.   Pharynx:  Normal Neck:  Supple.  Enlarged posterior/lateral nodes are palpated bilaterally, tender to palpation on the left.   Lungs:  Clear to auscultation.  Breath sounds are equal.  Moving air well. Heart:  Regular rate and rhythm without murmurs, rubs, or gallops.  Abdomen:  Nontender without masses or hepatosplenomegaly.  Bowel sounds are present.  No CVA or flank tenderness.  Extremities:  No edema.  Skin:  No rash present.    UC Treatments / Results  Labs (all labs ordered are listed, but only abnormal results are displayed) Labs Reviewed - No data to display  EKG None  Radiology No results found.  Procedures Procedures (including critical care time)  Medications Ordered in UC Medications - No data to display  Initial Impression / Assessment and Plan / UC Course  I have reviewed the triage vital signs and the nursing notes.  Pertinent labs & imaging results that were available during my care of the patient were reviewed by me and considered in my medical decision making (see chart for details).    There is no evidence of bacterial infection today.  Treat symptomatically for now  Prescription written for Benzonatate (Tessalon) to take at bedtime for night-time cough.  Followup with Family Doctor if not improved in about 10 days.   Final Clinical Impressions(s) / UC Diagnoses   Final diagnoses:  Viral URI with cough  Viral conjunctivitis of left eye     Discharge Instructions     Take plain guaifenesin (1200mg  extended release tabs such as Mucinex) twice daily, with plenty of water, for cough and congestion.   Get adequate rest.   May use Afrin nasal spray (or generic oxymetazoline) each morning for about 5 days and then discontinue.  Also recommend using saline nasal spray several times daily and saline nasal  irrigation (AYR is a common brand).  Use Flonase nasal spray each morning after using Afrin nasal spray and saline nasal irrigation. Try warm salt water gargles for sore throat.  Stop all antihistamines for now, and other non-prescription cough/cold preparations. May take Delsym Cough Suppressant with Tessalon at bedtime for nighttime cough.  Begin Amoxicillin if not improving about one week or if persistent fever develops.(Given a prescription to hold, with an expiration date)   Try  using chilled lubricating eye drops for eye irritation.     ED Prescriptions    Medication Sig Dispense Auth. Provider   benzonatate (TESSALON) 200 MG capsule Take one cap by mouth at bedtime as needed for cough.  May repeat in 4 to 6 hours 15 capsule Kandra Nicolas, MD   amoxicillin (AMOXIL) 875 MG tablet Take 1 tablet (875 mg total) by mouth 2 (two) times daily. (Rx void after 12/18/18) 20 tablet Kandra Nicolas, MD         Kandra Nicolas, MD 12/12/18 [phone]

## 2019-09-24 ENCOUNTER — Other Ambulatory Visit: Payer: Self-pay | Admitting: Obstetrics and Gynecology

## 2019-09-24 DIAGNOSIS — Z1231 Encounter for screening mammogram for malignant neoplasm of breast: Secondary | ICD-10-CM

## 2019-10-26 ENCOUNTER — Emergency Department
Admission: EM | Admit: 2019-10-26 | Discharge: 2019-10-26 | Disposition: A | Discharge status: Home / Self Care | Payer: BC Managed Care – PPO | Source: Home / Self Care

## 2019-10-26 ENCOUNTER — Other Ambulatory Visit: Payer: Self-pay

## 2019-10-26 ENCOUNTER — Encounter: Payer: Self-pay | Admitting: Emergency Medicine

## 2019-10-26 DIAGNOSIS — J301 Allergic rhinitis due to pollen: Secondary | ICD-10-CM

## 2019-10-26 DIAGNOSIS — H6121 Impacted cerumen, right ear: Secondary | ICD-10-CM

## 2019-10-26 MED ORDER — LORATADINE 10 MG PO TABS: 10.0000 mg | ORAL_TABLET | Freq: Every day | ORAL | 0 refills | Status: DC

## 2019-10-26 MED ORDER — FLUTICASONE PROPIONATE 50 MCG/ACT NA SUSP: 1.0000 | Freq: Every day | NASAL | 0 refills | Status: DC

## 2019-10-26 NOTE — Discharge Instructions (Signed)
Take medications, nasal steroid spray as prescribed. Do not stick Q-tips in the ear. Ask pharmacy about earwax softening drops. Return for worsening pain, fever, sore throat.

## 2019-10-26 NOTE — ED Provider Notes (Signed)
[name]    CSN: [id number] Arrival date & time: 10/26/19  1342      History   Chief Complaint Chief Complaint  Patient presents with  . Otalgia    HPI Lindsey Barker is a 58 y.o. female with history of allergies presenting for right ear pain for the last 2 days.  States this is preceded by using Q-tips.  Patient does endorse history of increased cerumen production: Does not do anything for this.  Patient denies decreased hearing, trauma to the area, URI symptoms.  Patient not currently taking anything for allergies.   Past Medical History:  Diagnosis Date  . Anemia, unspecified 04/17/2013    Patient Active Problem List   Diagnosis Date Noted  . Anemia 04/17/2013    Past Surgical History:  Procedure Laterality Date  . ABDOMINAL SURGERY  1997   c-section  . DILATATION & CURETTAGE/HYSTEROSCOPY WITH TRUECLEAR N/A 11/15/2013   Procedure: DILATATION & CURETTAGE/HYSTEROSCOPY WITH TRUCLEAR;  Surgeon: Margarette Asal, MD;  Location: [facility];  Service: Gynecology;  Laterality: N/A;    OB History   No obstetric history on file.      Home Medications    Prior to Admission medications   Medication Sig Start Date End Date Taking? Authorizing Provider  aspirin-acetaminophen-caffeine [facility] MIGRAINE) [phone] MG tablet Take 1-2 tabs every 6-8 hours for headache 10/12/15   Noe Gens, PA-C  Diclofenac Sodium 3 % GEL Apply 2 g to your ankle 3 times a day. 03/21/18   Darlyne Russian, MD  fluticasone (FLONASE) 50 MCG/ACT nasal spray Place 1 spray into both nostrils daily. 10/26/19   Hall-Potvin, [place], PA-C  loratadine (CLARITIN) 10 MG tablet Take 1 tablet (10 mg total) by mouth daily. 10/26/19   Hall-Potvin, [place], PA-C    Family History Family History  Problem Relation Age of Onset  . Diabetes Father   . Hypertension Father     Social History Social History   Tobacco Use  . Smoking status: Never Smoker  . Smokeless tobacco: Never Used   Substance Use Topics  . Alcohol use: Yes    Comment: occasionally 1 drink per mth  . Drug use: No     Allergies   Tetracyclines & related   Review of Systems Review of Systems  Constitutional: Negative for fatigue and fever.  HENT: Positive for ear pain. Negative for congestion, dental problem, ear discharge, facial swelling, hearing loss, sinus pain, sore throat, trouble swallowing and voice change.   Eyes: Negative for photophobia, pain and visual disturbance.  Respiratory: Negative for cough and shortness of breath.   Cardiovascular: Negative for chest pain and palpitations.  Gastrointestinal: Negative for diarrhea and vomiting.  Musculoskeletal: Negative for arthralgias and myalgias.  Neurological: Negative for dizziness and headaches.     Physical Exam Triage Vital Signs ED Triage Vitals  Enc Vitals Group     BP 10/26/19 1458 (!) 149/109     Pulse Rate 10/26/19 1458 71     Resp 10/26/19 1458 16     Temp 10/26/19 1458 98.2 F (36.8 C)     Temp Source 10/26/19 1458 Oral     SpO2 10/26/19 1458 95 %     Weight 10/26/19 1459 193 lb 1.9 oz (87.6 kg)     Height 10/26/19 1459 5\' 7"  (1.702 m)     Head Circumference --      Peak Flow --      Pain Score 10/26/19 1459 0  Pain Loc --      Pain Edu? --      Excl. in [place]? --    No data found.  Updated Vital Signs BP (!) 149/109 (BP Location: Right Arm)   Pulse 71   Temp 98.2 F (36.8 C) (Oral)   Resp 16   Ht 5\' 7"  (1.702 m)   Wt 193 lb 1.9 oz (87.6 kg)   LMP  (LMP Unknown) Comment: peri-menopause  SpO2 95%   BMI 30.25 kg/m   Visual Acuity Right Eye Distance:   Left Eye Distance:   Bilateral Distance:    Right Eye Near:   Left Eye Near:    Bilateral Near:     Physical Exam Constitutional:      General: She is not in acute distress. HENT:     Head: Normocephalic and atraumatic.     Jaw: There is normal jaw occlusion. No tenderness or pain on movement.     Right Ear: Hearing, ear canal and external ear  normal. No tenderness. There is impacted cerumen. No mastoid tenderness.     Left Ear: Hearing, tympanic membrane, ear canal and external ear normal. No tenderness. No mastoid tenderness.     Ears:     Comments: S/p earwax removal via lavage: TM intact with scant clear fluid/bubbles.  No bulging, retraction.    Nose: No nasal deformity, septal deviation or nasal tenderness.     Right Turbinates: Not swollen or pale.     Left Turbinates: Not swollen or pale.     Right Sinus: No maxillary sinus tenderness or frontal sinus tenderness.     Left Sinus: No maxillary sinus tenderness or frontal sinus tenderness.     Comments: Bilateral turbinate edema with pink mucosa    Mouth/Throat:     Lips: Pink. No lesions.     Mouth: Mucous membranes are moist. No injury.     Pharynx: Oropharynx is clear. Uvula midline. No posterior oropharyngeal erythema or uvula swelling.     Comments: no tonsillar exudate or hypertrophy Neck:     Musculoskeletal: Normal range of motion and neck supple. No muscular tenderness.  Cardiovascular:     Rate and Rhythm: Normal rate.  Pulmonary:     Effort: Pulmonary effort is normal.  Lymphadenopathy:     Cervical: No cervical adenopathy.  Neurological:     Mental Status: She is alert and oriented to person, place, and time.      UC Treatments / Results  Labs (all labs ordered are listed, but only abnormal results are displayed) Labs Reviewed - No data to display  EKG   Radiology No results found.  Procedures Procedures (including critical care time)  Medications Ordered in UC Medications - No data to display  Initial Impression / Assessment and Plan / UC Course  I have reviewed the triage vital signs and the nursing notes.  Pertinent labs & imaging results that were available during my care of the patient were reviewed by me and considered in my medical decision making (see chart for details).     Right ear pain second to cerumen impaction, eustachian  tube dysfunction.  Cerumen successfully removed in office, patient tolerated well.  Reports improved pain at time of discharge.  Will use intranasal steroid, antihistamine to help with ET dysfunction symptoms.  Return precautions discussed, patient verbalized understanding and is agreeable to plan. Final Clinical Impressions(s) / UC Diagnoses   Final diagnoses:  Impacted cerumen of right ear  Seasonal allergic rhinitis due  to pollen     Discharge Instructions     Take medications, nasal steroid spray as prescribed. Do not stick Q-tips in the ear. Ask pharmacy about earwax softening drops. Return for worsening pain, fever, sore throat.    ED Prescriptions    Medication Sig Dispense Auth. Provider   fluticasone (FLONASE) 50 MCG/ACT nasal spray Place 1 spray into both nostrils daily. 16 g Hall-Potvin, [place], PA-C   loratadine (CLARITIN) 10 MG tablet Take 1 tablet (10 mg total) by mouth daily. 30 tablet Hall-Potvin, [place], PA-C     PDMP not reviewed this encounter.   Hall-Potvin, [place], [place] 10/26/19 1532

## 2019-10-26 NOTE — ED Triage Notes (Signed)
Patient presents to [facility] with C/O a full sensation in the right ear times two day, denies pain or other symptoms.

## 2019-11-13 ENCOUNTER — Ambulatory Visit: Payer: BC Managed Care – PPO

## 2019-11-21 ENCOUNTER — Ambulatory Visit (INDEPENDENT_AMBULATORY_CARE_PROVIDER_SITE_OTHER): Payer: BC Managed Care – PPO

## 2019-11-21 ENCOUNTER — Other Ambulatory Visit: Payer: Self-pay

## 2019-11-21 DIAGNOSIS — Z1231 Encounter for screening mammogram for malignant neoplasm of breast: Secondary | ICD-10-CM

## 2020-03-11 IMAGING — MG DIGITAL SCREENING BILAT W/ TOMO W/ CAD
6 of 12 series · 6 of 36 positions shown · non-contrast
Comparison: Previous exam(s).

CLINICAL DATA: Screening.

EXAM:
DIGITAL SCREENING BILATERAL MAMMOGRAM WITH TOMO AND CAD

[L MLO synth-2D (1 of 2)]
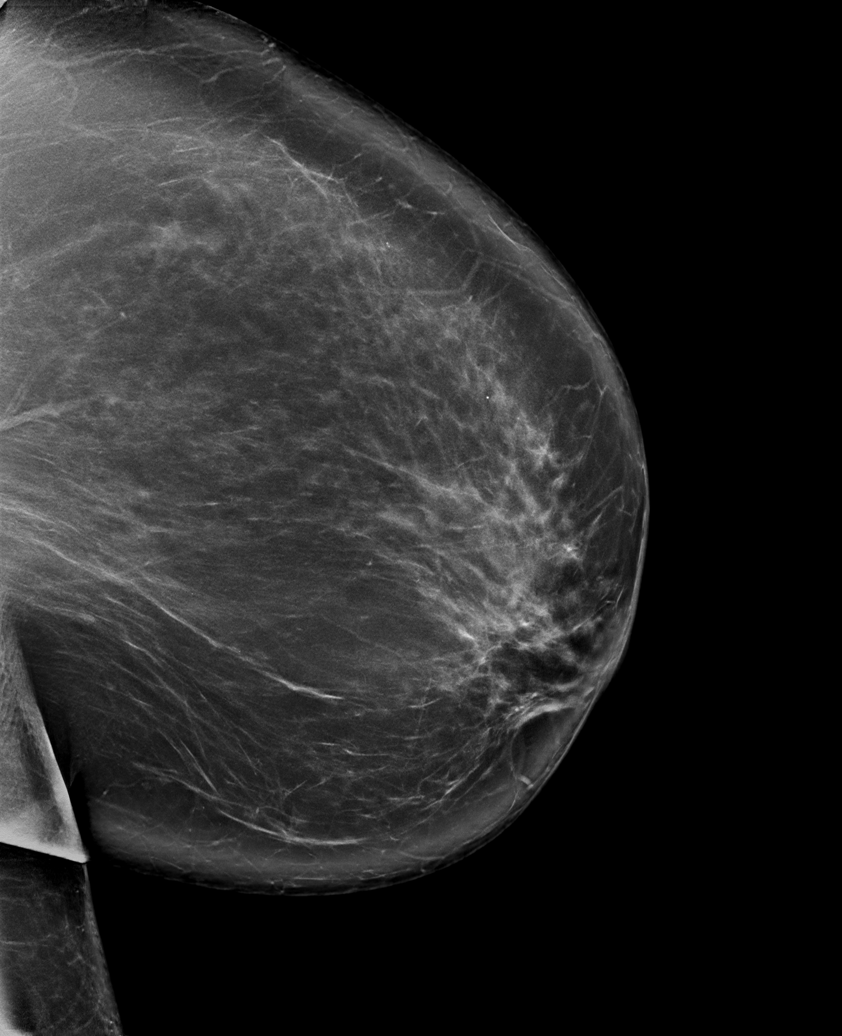

[L CC synth-2D]
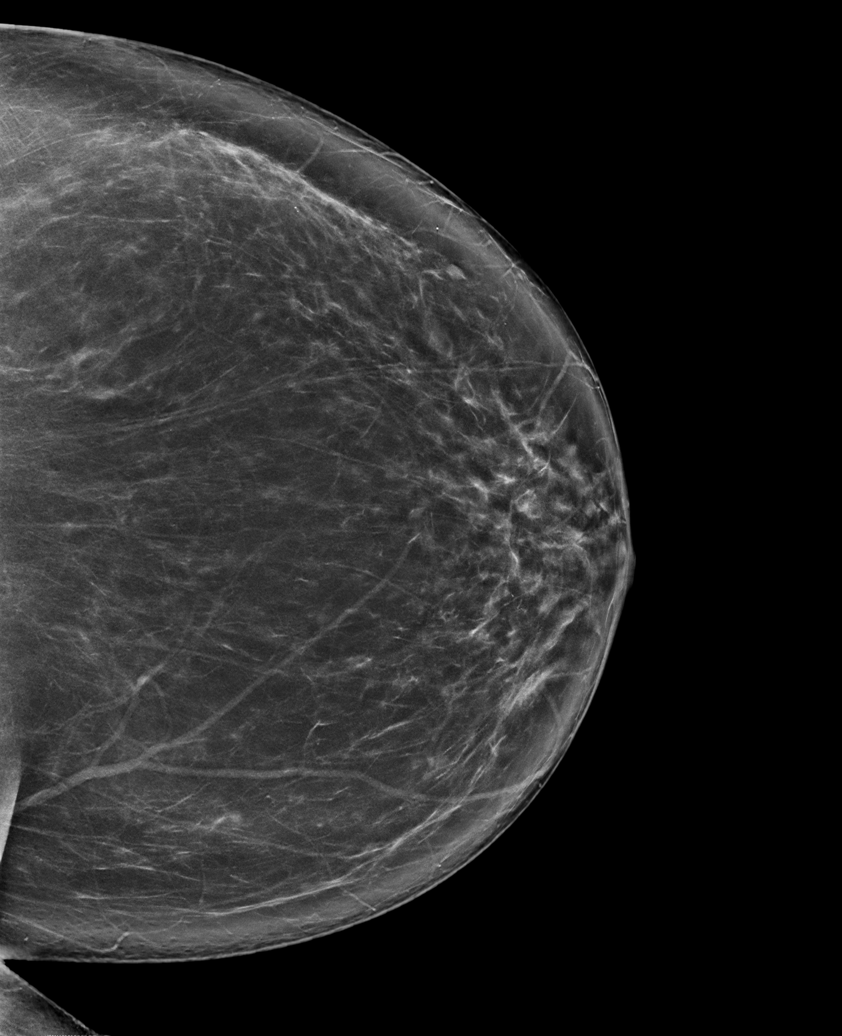

[R MLO synth-2D]
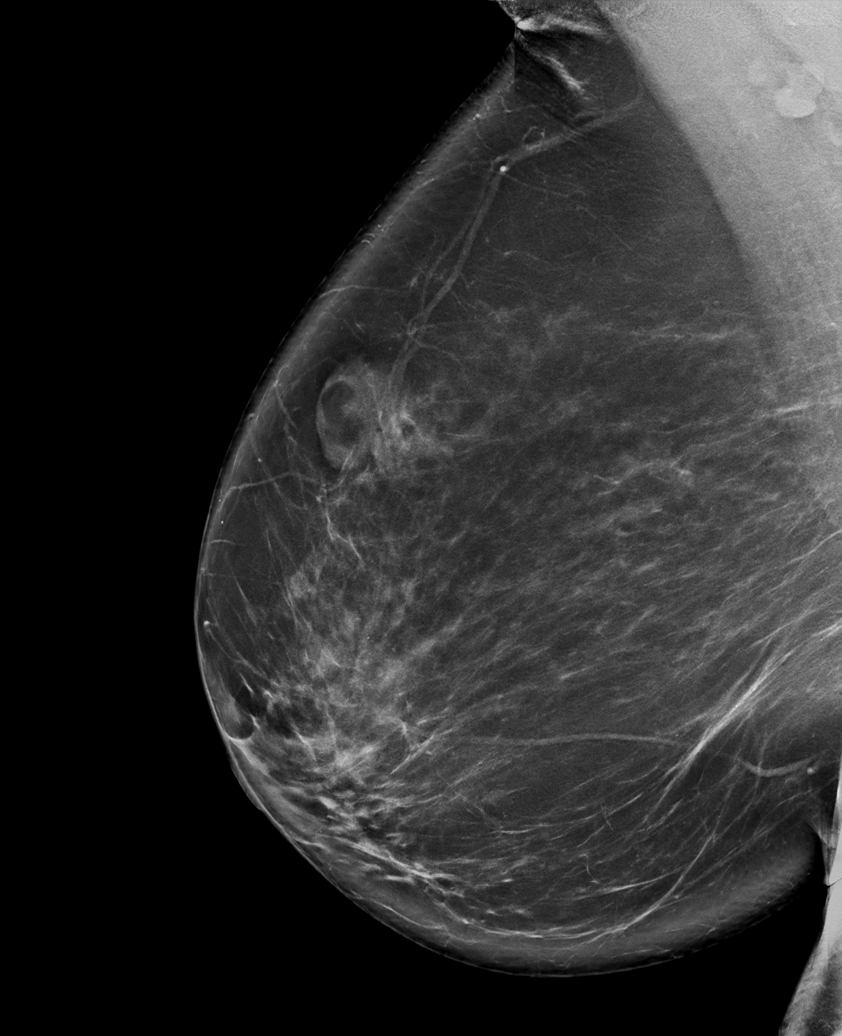

[R CC synth-2D]
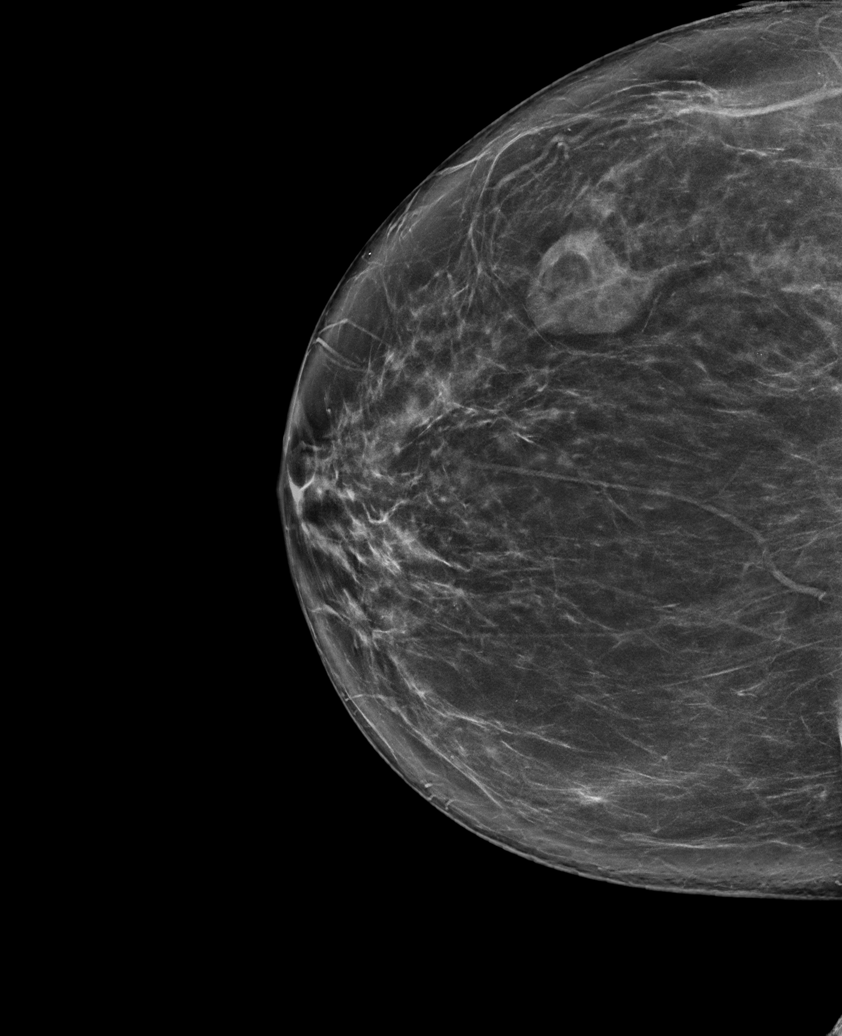

[L MLO synth-2D (2 of 2)]
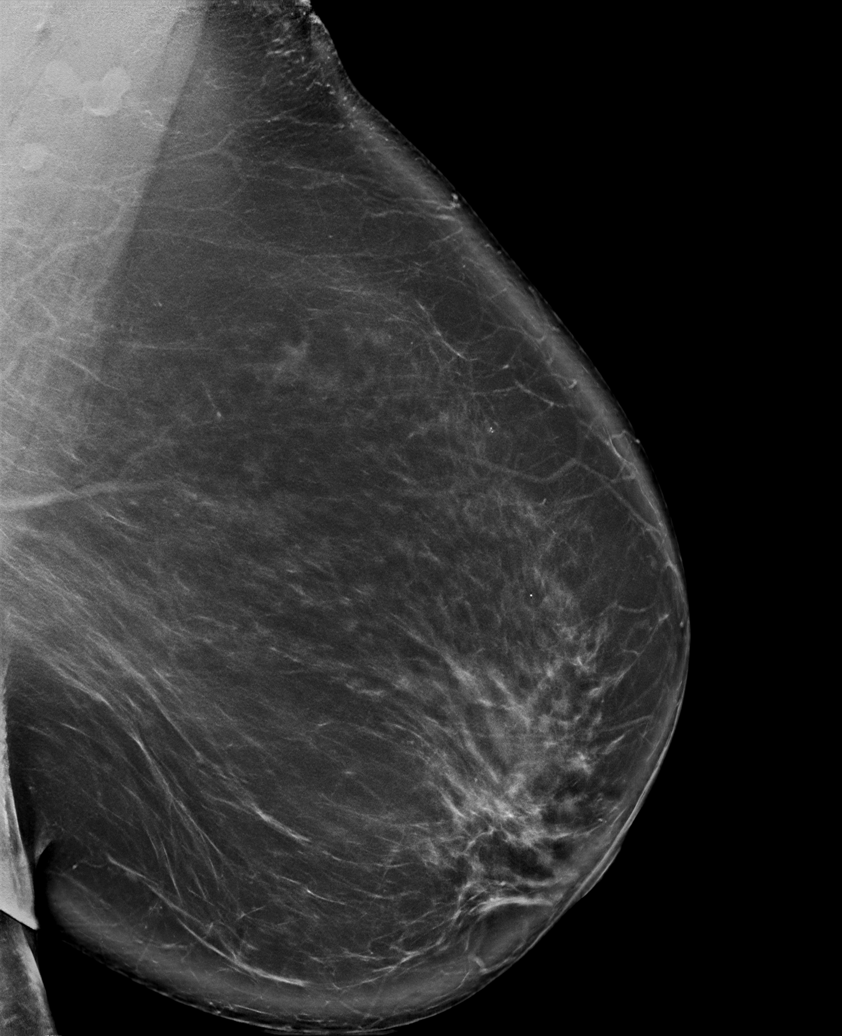

[R XCCL synth-2D]
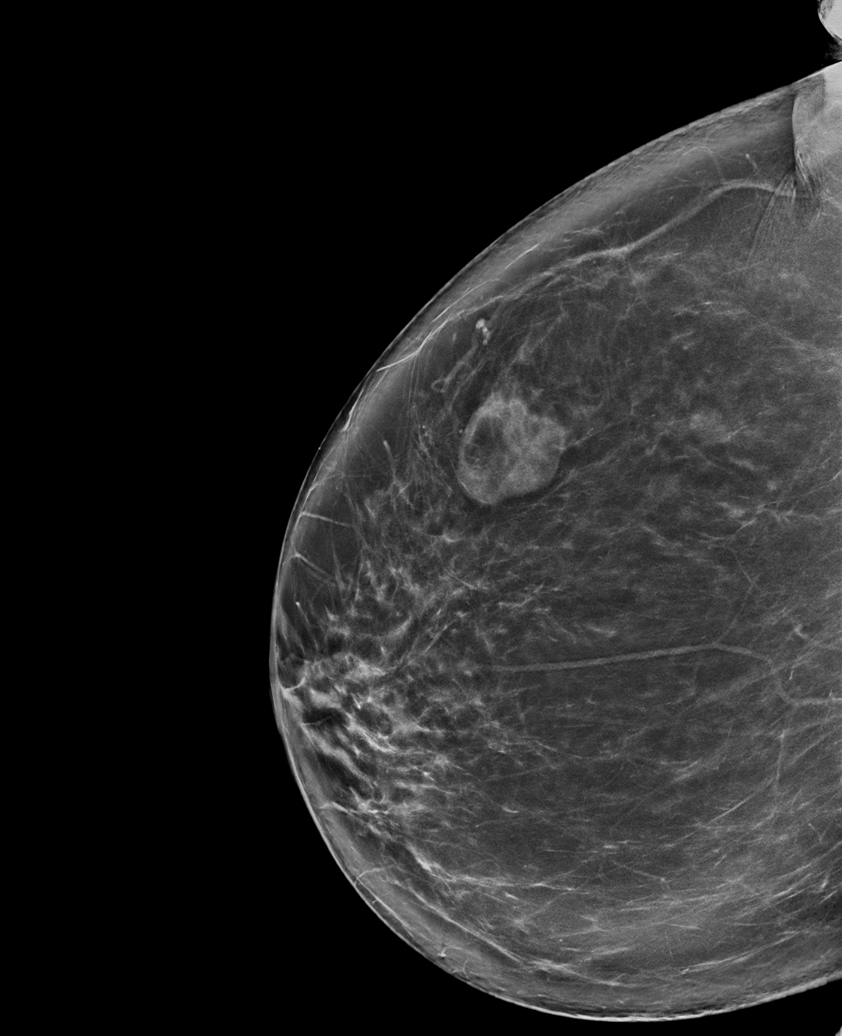

[6 of 36 positions shown; findings below may reference images not displayed]

ACR Breast Density Category b: There are scattered areas of
fibroglandular density.
FINDINGS: There are no findings suspicious for malignancy. Images were
processed with CAD.
IMPRESSION: No mammographic evidence of malignancy. A result letter of this
screening mammogram will be mailed directly to the patient.

RECOMMENDATION:
Screening mammogram in one year. (Code:CN-U-775)

BI-RADS CATEGORY  1: Negative.

## 2020-10-29 ENCOUNTER — Other Ambulatory Visit: Payer: Self-pay | Admitting: Obstetrics and Gynecology

## 2020-10-29 DIAGNOSIS — Z1231 Encounter for screening mammogram for malignant neoplasm of breast: Secondary | ICD-10-CM

## 2020-12-24 ENCOUNTER — Ambulatory Visit: Payer: BC Managed Care – PPO

## 2021-01-28 ENCOUNTER — Ambulatory Visit: Payer: Self-pay

## 2021-02-05 ENCOUNTER — Ambulatory Visit (INDEPENDENT_AMBULATORY_CARE_PROVIDER_SITE_OTHER): Payer: BC Managed Care – PPO

## 2021-02-05 ENCOUNTER — Other Ambulatory Visit: Payer: Self-pay

## 2021-02-05 DIAGNOSIS — Z1231 Encounter for screening mammogram for malignant neoplasm of breast: Secondary | ICD-10-CM

## 2021-12-09 ENCOUNTER — Other Ambulatory Visit (HOSPITAL_COMMUNITY)
Admission: RE | Admit: 2021-12-09 | Discharge: 2021-12-09 | Disposition: A | Discharge status: Home / Self Care | Payer: BC Managed Care – PPO | Source: Ambulatory Visit | Attending: Obstetrics and Gynecology | Admitting: Obstetrics and Gynecology

## 2021-12-09 ENCOUNTER — Ambulatory Visit (INDEPENDENT_AMBULATORY_CARE_PROVIDER_SITE_OTHER): Payer: BC Managed Care – PPO | Admitting: Obstetrics and Gynecology

## 2021-12-09 ENCOUNTER — Encounter: Payer: Self-pay | Admitting: *Deleted

## 2021-12-09 ENCOUNTER — Encounter: Payer: Self-pay | Admitting: Obstetrics and Gynecology

## 2021-12-09 ENCOUNTER — Other Ambulatory Visit: Payer: Self-pay

## 2021-12-09 DIAGNOSIS — Z01419 Encounter for gynecological examination (general) (routine) without abnormal findings: Secondary | ICD-10-CM

## 2021-12-09 NOTE — Progress Notes (Signed)
GYNECOLOGY ANNUAL PREVENTATIVE CARE ENCOUNTER NOTE  History:     Lindsey Barker is a 61 y.o. G1P1 female here for a routine annual gynecologic exam.    Current complaints: None.     She is seldom sexually active.   She has no PMB. She has no other concerns today.   Denies abnormal vaginal bleeding, discharge, pelvic pain, problems with intercourse or other gynecologic concerns.     Gynecologic History No LMP recorded (lmp unknown). Patient is postmenopausal. Last Pap: unknown - [facility] Last Mammogram: 01/2021.  Result was normal Last Colonoscopy: She has them done and reports she is up to date.   Obstetric History OB History  No obstetric history on file.    Past Medical History:  Diagnosis Date   Anemia, unspecified 04/17/2013    Past Surgical History:  Procedure Laterality Date   ABDOMINAL SURGERY  1997   c-section   DILATATION & CURETTAGE/HYSTEROSCOPY WITH TRUECLEAR N/A 11/15/2013   Procedure: DILATATION & CURETTAGE/HYSTEROSCOPY WITH TRUCLEAR;  Surgeon: Margarette Asal, MD;  Location: [facility];  Service: Gynecology;  Laterality: N/A;    Current Outpatient Medications on File Prior to Visit  Medication Sig Dispense Refill   aspirin-acetaminophen-caffeine (EXCEDRIN MIGRAINE) 250-250-65 MG tablet Take 1-2 tabs every 6-8 hours for headache 30 tablet 0   Diclofenac Sodium 3 % GEL Apply 2 g to your ankle 3 times a day. 100 g 1   fluticasone (FLONASE) 50 MCG/ACT nasal spray Place 1 spray into both nostrils daily. 16 g 0   loratadine (CLARITIN) 10 MG tablet Take 1 tablet (10 mg total) by mouth daily. 30 tablet 0   phentermine (ADIPEX-P) 37.5 MG tablet Take 37.5 mg by mouth daily.     No current facility-administered medications on file prior to visit.    Allergies  Allergen Reactions   Tetracyclines & Related     Social History:  reports that she has never smoked. She has never used smokeless tobacco. She reports current alcohol  use. She reports that she does not use drugs.  Family History  Problem Relation Age of Onset   Diabetes Father    Hypertension Father     The following portions of the patient's history were reviewed and updated as appropriate: allergies, current medications, past family history, past medical history, past social history, past surgical history and problem list.  Review of Systems Pertinent items noted in HPI and remainder of comprehensive ROS otherwise negative.  Physical Exam:  LMP  (LMP Unknown) Comment: peri-menopause CONSTITUTIONAL: Well-developed, well-nourished female in no acute distress.  HENT:  Normocephalic, atraumatic, External right and left ear normal.  EYES: Conjunctivae and EOM are normal. Pupils are equal, round, and reactive to light. No scleral icterus.  NECK: Normal range of motion, supple, no masses.  Normal thyroid.  SKIN: Skin is warm and dry. No rash noted. Not diaphoretic. No erythema. No pallor. MUSCULOSKELETAL: Normal range of motion. No tenderness.  No cyanosis, clubbing, or edema. NEUROLOGIC: Alert and oriented to person, place, and time. Normal reflexes, muscle tone coordination.  PSYCHIATRIC: Normal mood and affect. Normal behavior. Normal judgment and thought content.  CARDIOVASCULAR: Normal heart rate noted, regular rhythm RESPIRATORY: Clear to auscultation bilaterally. Effort and breath sounds normal, no problems with respiration noted.  BREASTS: Symmetric in size. No masses, tenderness, skin changes, nipple drainage, or lymphadenopathy bilaterally. Performed in the presence of a chaperone. ABDOMEN: Soft, no distention noted.  No tenderness, rebound or guarding.  PELVIC: External genitalia normal, Vagina normal without discharge, Urethra without abnormality or discharge, no bladder tenderness, cervix normal in appearance, no CMT, uterus normal size, shape, and consistency, no adnexal masses or tenderness. Performed in the presence of a  chaperone.  Assessment and Plan:    1. Encounter for annual routine gynecological examination - Cervical cancer screening: Discussed guidelines. Pap with HPV done today - Breast Health: Encouraged self breast awareness/SBE. Discussed limits of clinical breast exam for detecting breast cancer. Discussed importance of annual MXR.  Rx given - Climacteric/Sexual health: Reviewed typical and atypical symptoms of menopause/peri-menopause. Discussed PMB and to call if any amount of spotting.  - Bone Health: Calcium via diet and supplementation. Discussed weight bearing exercise. DEXA due by age 64 - Colonoscopy: up to date - F/U 12 months and prn  - Referral given for PCP to [place].   Routine preventative health maintenance measures emphasized. Please refer to After Visit Summary for other counseling recommendations.   Radene Gunning, MD, [facility], [place]

## 2021-12-10 LAB — CYTOLOGY - PAP
Adequacy: ABSENT
Comment: NEGATIVE
Diagnosis: NEGATIVE
High risk HPV: NEGATIVE

## 2022-06-09 ENCOUNTER — Ambulatory Visit (INDEPENDENT_AMBULATORY_CARE_PROVIDER_SITE_OTHER): Payer: BC Managed Care – PPO

## 2022-06-09 DIAGNOSIS — Z1231 Encounter for screening mammogram for malignant neoplasm of breast: Secondary | ICD-10-CM

## 2022-06-09 DIAGNOSIS — Z01419 Encounter for gynecological examination (general) (routine) without abnormal findings: Secondary | ICD-10-CM

## 2023-11-27 ENCOUNTER — Encounter: Payer: Self-pay | Admitting: Oncology
